# Patient Record
Sex: Male | Born: 1945 | Race: White | Hispanic: No | Marital: Married | Smoking: Former smoker
Health system: Southern US, Community
[De-identification: ages and names within clinical notes are randomized; demographics above are authoritative.]

## PROBLEM LIST (undated history)

## (undated) DIAGNOSIS — E7800 Pure hypercholesterolemia, unspecified: Secondary | ICD-10-CM

## (undated) DIAGNOSIS — I10 Essential (primary) hypertension: Secondary | ICD-10-CM

## (undated) DIAGNOSIS — E079 Disorder of thyroid, unspecified: Secondary | ICD-10-CM

## (undated) DIAGNOSIS — E119 Type 2 diabetes mellitus without complications: Secondary | ICD-10-CM

## (undated) HISTORY — PX: ELBOW SURGERY: SHX618

---

## 2017-01-29 DIAGNOSIS — Z23 Encounter for immunization: Secondary | ICD-10-CM | POA: Diagnosis not present

## 2017-01-29 DIAGNOSIS — E1165 Type 2 diabetes mellitus with hyperglycemia: Secondary | ICD-10-CM | POA: Diagnosis not present

## 2017-01-29 DIAGNOSIS — E039 Hypothyroidism, unspecified: Secondary | ICD-10-CM | POA: Diagnosis not present

## 2017-01-29 DIAGNOSIS — Z6836 Body mass index (BMI) 36.0-36.9, adult: Secondary | ICD-10-CM | POA: Diagnosis not present

## 2017-01-29 DIAGNOSIS — R739 Hyperglycemia, unspecified: Secondary | ICD-10-CM | POA: Diagnosis not present

## 2017-01-29 DIAGNOSIS — R7309 Other abnormal glucose: Secondary | ICD-10-CM | POA: Diagnosis not present

## 2017-02-07 DIAGNOSIS — I10 Essential (primary) hypertension: Secondary | ICD-10-CM | POA: Diagnosis not present

## 2017-02-07 DIAGNOSIS — E785 Hyperlipidemia, unspecified: Secondary | ICD-10-CM | POA: Diagnosis not present

## 2017-02-07 DIAGNOSIS — Z1211 Encounter for screening for malignant neoplasm of colon: Secondary | ICD-10-CM | POA: Diagnosis not present

## 2017-02-07 DIAGNOSIS — E039 Hypothyroidism, unspecified: Secondary | ICD-10-CM | POA: Diagnosis not present

## 2017-02-07 DIAGNOSIS — K5730 Diverticulosis of large intestine without perforation or abscess without bleeding: Secondary | ICD-10-CM | POA: Diagnosis not present

## 2017-02-07 DIAGNOSIS — Z79899 Other long term (current) drug therapy: Secondary | ICD-10-CM | POA: Diagnosis not present

## 2017-02-07 DIAGNOSIS — Z8719 Personal history of other diseases of the digestive system: Secondary | ICD-10-CM | POA: Diagnosis not present

## 2017-02-07 DIAGNOSIS — Z87891 Personal history of nicotine dependence: Secondary | ICD-10-CM | POA: Diagnosis not present

## 2017-05-31 DIAGNOSIS — Z125 Encounter for screening for malignant neoplasm of prostate: Secondary | ICD-10-CM | POA: Diagnosis not present

## 2017-05-31 DIAGNOSIS — R739 Hyperglycemia, unspecified: Secondary | ICD-10-CM | POA: Diagnosis not present

## 2017-05-31 DIAGNOSIS — E1165 Type 2 diabetes mellitus with hyperglycemia: Secondary | ICD-10-CM | POA: Diagnosis not present

## 2017-05-31 DIAGNOSIS — E785 Hyperlipidemia, unspecified: Secondary | ICD-10-CM | POA: Diagnosis not present

## 2017-05-31 DIAGNOSIS — I10 Essential (primary) hypertension: Secondary | ICD-10-CM | POA: Diagnosis not present

## 2017-05-31 DIAGNOSIS — R7309 Other abnormal glucose: Secondary | ICD-10-CM | POA: Diagnosis not present

## 2017-05-31 DIAGNOSIS — Z6828 Body mass index (BMI) 28.0-28.9, adult: Secondary | ICD-10-CM | POA: Diagnosis not present

## 2017-05-31 DIAGNOSIS — E039 Hypothyroidism, unspecified: Secondary | ICD-10-CM | POA: Diagnosis not present

## 2017-05-31 DIAGNOSIS — E559 Vitamin D deficiency, unspecified: Secondary | ICD-10-CM | POA: Diagnosis not present

## 2017-06-24 DIAGNOSIS — K623 Rectal prolapse: Secondary | ICD-10-CM | POA: Diagnosis not present

## 2017-06-24 DIAGNOSIS — R198 Other specified symptoms and signs involving the digestive system and abdomen: Secondary | ICD-10-CM | POA: Diagnosis not present

## 2017-06-24 DIAGNOSIS — Z6828 Body mass index (BMI) 28.0-28.9, adult: Secondary | ICD-10-CM | POA: Diagnosis not present

## 2017-06-24 DIAGNOSIS — K625 Hemorrhage of anus and rectum: Secondary | ICD-10-CM | POA: Diagnosis not present

## 2017-07-05 DIAGNOSIS — K600 Acute anal fissure: Secondary | ICD-10-CM | POA: Diagnosis not present

## 2017-08-30 DIAGNOSIS — Z6828 Body mass index (BMI) 28.0-28.9, adult: Secondary | ICD-10-CM | POA: Diagnosis not present

## 2017-08-30 DIAGNOSIS — R7309 Other abnormal glucose: Secondary | ICD-10-CM | POA: Diagnosis not present

## 2017-08-30 DIAGNOSIS — R739 Hyperglycemia, unspecified: Secondary | ICD-10-CM | POA: Diagnosis not present

## 2017-08-30 DIAGNOSIS — Z0189 Encounter for other specified special examinations: Secondary | ICD-10-CM | POA: Diagnosis not present

## 2017-08-30 DIAGNOSIS — Z23 Encounter for immunization: Secondary | ICD-10-CM | POA: Diagnosis not present

## 2017-08-30 DIAGNOSIS — E785 Hyperlipidemia, unspecified: Secondary | ICD-10-CM | POA: Diagnosis not present

## 2017-08-30 DIAGNOSIS — I10 Essential (primary) hypertension: Secondary | ICD-10-CM | POA: Diagnosis not present

## 2017-08-30 DIAGNOSIS — E1165 Type 2 diabetes mellitus with hyperglycemia: Secondary | ICD-10-CM | POA: Diagnosis not present

## 2017-12-03 DIAGNOSIS — I10 Essential (primary) hypertension: Secondary | ICD-10-CM | POA: Diagnosis not present

## 2017-12-03 DIAGNOSIS — E785 Hyperlipidemia, unspecified: Secondary | ICD-10-CM | POA: Diagnosis not present

## 2017-12-03 DIAGNOSIS — Z6828 Body mass index (BMI) 28.0-28.9, adult: Secondary | ICD-10-CM | POA: Diagnosis not present

## 2017-12-03 DIAGNOSIS — R739 Hyperglycemia, unspecified: Secondary | ICD-10-CM | POA: Diagnosis not present

## 2017-12-03 DIAGNOSIS — Z0189 Encounter for other specified special examinations: Secondary | ICD-10-CM | POA: Diagnosis not present

## 2017-12-03 DIAGNOSIS — E039 Hypothyroidism, unspecified: Secondary | ICD-10-CM | POA: Diagnosis not present

## 2017-12-03 DIAGNOSIS — R7309 Other abnormal glucose: Secondary | ICD-10-CM | POA: Diagnosis not present

## 2018-01-07 DIAGNOSIS — L255 Unspecified contact dermatitis due to plants, except food: Secondary | ICD-10-CM | POA: Diagnosis not present

## 2018-06-25 DIAGNOSIS — S46301A Unspecified injury of muscle, fascia and tendon of triceps, right arm, initial encounter: Secondary | ICD-10-CM | POA: Diagnosis not present

## 2018-06-30 DIAGNOSIS — S46211A Strain of muscle, fascia and tendon of other parts of biceps, right arm, initial encounter: Secondary | ICD-10-CM | POA: Diagnosis not present

## 2018-06-30 DIAGNOSIS — M25521 Pain in right elbow: Secondary | ICD-10-CM | POA: Diagnosis not present

## 2018-07-01 DIAGNOSIS — R600 Localized edema: Secondary | ICD-10-CM | POA: Diagnosis not present

## 2018-07-01 DIAGNOSIS — S56511A Strain of other extensor muscle, fascia and tendon at forearm level, right arm, initial encounter: Secondary | ICD-10-CM | POA: Diagnosis not present

## 2018-07-01 DIAGNOSIS — S46211A Strain of muscle, fascia and tendon of other parts of biceps, right arm, initial encounter: Secondary | ICD-10-CM | POA: Diagnosis not present

## 2018-07-03 DIAGNOSIS — I10 Essential (primary) hypertension: Secondary | ICD-10-CM | POA: Diagnosis not present

## 2018-07-03 DIAGNOSIS — S46211A Strain of muscle, fascia and tendon of other parts of biceps, right arm, initial encounter: Secondary | ICD-10-CM | POA: Diagnosis not present

## 2018-07-21 DIAGNOSIS — S46211A Strain of muscle, fascia and tendon of other parts of biceps, right arm, initial encounter: Secondary | ICD-10-CM | POA: Diagnosis not present

## 2018-07-30 DIAGNOSIS — M6281 Muscle weakness (generalized): Secondary | ICD-10-CM | POA: Diagnosis not present

## 2018-07-30 DIAGNOSIS — M25421 Effusion, right elbow: Secondary | ICD-10-CM | POA: Diagnosis not present

## 2018-07-30 DIAGNOSIS — M25621 Stiffness of right elbow, not elsewhere classified: Secondary | ICD-10-CM | POA: Diagnosis not present

## 2018-07-30 DIAGNOSIS — M25521 Pain in right elbow: Secondary | ICD-10-CM | POA: Diagnosis not present

## 2018-08-04 DIAGNOSIS — M25421 Effusion, right elbow: Secondary | ICD-10-CM | POA: Diagnosis not present

## 2018-08-04 DIAGNOSIS — M6281 Muscle weakness (generalized): Secondary | ICD-10-CM | POA: Diagnosis not present

## 2018-08-04 DIAGNOSIS — M25521 Pain in right elbow: Secondary | ICD-10-CM | POA: Diagnosis not present

## 2018-08-04 DIAGNOSIS — M25621 Stiffness of right elbow, not elsewhere classified: Secondary | ICD-10-CM | POA: Diagnosis not present

## 2018-08-07 DIAGNOSIS — M6281 Muscle weakness (generalized): Secondary | ICD-10-CM | POA: Diagnosis not present

## 2018-08-07 DIAGNOSIS — M25621 Stiffness of right elbow, not elsewhere classified: Secondary | ICD-10-CM | POA: Diagnosis not present

## 2018-08-07 DIAGNOSIS — M25421 Effusion, right elbow: Secondary | ICD-10-CM | POA: Diagnosis not present

## 2018-08-07 DIAGNOSIS — M25521 Pain in right elbow: Secondary | ICD-10-CM | POA: Diagnosis not present

## 2018-08-11 DIAGNOSIS — Z9889 Other specified postprocedural states: Secondary | ICD-10-CM | POA: Diagnosis not present

## 2018-08-12 DIAGNOSIS — M25421 Effusion, right elbow: Secondary | ICD-10-CM | POA: Diagnosis not present

## 2018-08-12 DIAGNOSIS — M25621 Stiffness of right elbow, not elsewhere classified: Secondary | ICD-10-CM | POA: Diagnosis not present

## 2018-08-12 DIAGNOSIS — M25521 Pain in right elbow: Secondary | ICD-10-CM | POA: Diagnosis not present

## 2018-08-12 DIAGNOSIS — M6281 Muscle weakness (generalized): Secondary | ICD-10-CM | POA: Diagnosis not present

## 2018-08-14 DIAGNOSIS — M25421 Effusion, right elbow: Secondary | ICD-10-CM | POA: Diagnosis not present

## 2018-08-14 DIAGNOSIS — M25621 Stiffness of right elbow, not elsewhere classified: Secondary | ICD-10-CM | POA: Diagnosis not present

## 2018-08-14 DIAGNOSIS — M25521 Pain in right elbow: Secondary | ICD-10-CM | POA: Diagnosis not present

## 2018-08-14 DIAGNOSIS — M6281 Muscle weakness (generalized): Secondary | ICD-10-CM | POA: Diagnosis not present

## 2018-08-18 DIAGNOSIS — M25521 Pain in right elbow: Secondary | ICD-10-CM | POA: Diagnosis not present

## 2018-08-18 DIAGNOSIS — M25421 Effusion, right elbow: Secondary | ICD-10-CM | POA: Diagnosis not present

## 2018-08-18 DIAGNOSIS — M25621 Stiffness of right elbow, not elsewhere classified: Secondary | ICD-10-CM | POA: Diagnosis not present

## 2018-08-18 DIAGNOSIS — M6281 Muscle weakness (generalized): Secondary | ICD-10-CM | POA: Diagnosis not present

## 2018-09-22 DIAGNOSIS — Z9889 Other specified postprocedural states: Secondary | ICD-10-CM | POA: Diagnosis not present

## 2018-10-13 DIAGNOSIS — Z6828 Body mass index (BMI) 28.0-28.9, adult: Secondary | ICD-10-CM | POA: Diagnosis not present

## 2018-10-13 DIAGNOSIS — I10 Essential (primary) hypertension: Secondary | ICD-10-CM | POA: Diagnosis not present

## 2018-10-13 DIAGNOSIS — E038 Other specified hypothyroidism: Secondary | ICD-10-CM | POA: Diagnosis not present

## 2018-10-13 DIAGNOSIS — E7849 Other hyperlipidemia: Secondary | ICD-10-CM | POA: Diagnosis not present

## 2018-10-13 DIAGNOSIS — E119 Type 2 diabetes mellitus without complications: Secondary | ICD-10-CM | POA: Diagnosis not present

## 2018-10-13 DIAGNOSIS — Z1389 Encounter for screening for other disorder: Secondary | ICD-10-CM | POA: Diagnosis not present

## 2018-10-13 DIAGNOSIS — Z23 Encounter for immunization: Secondary | ICD-10-CM | POA: Diagnosis not present

## 2018-10-13 DIAGNOSIS — Z125 Encounter for screening for malignant neoplasm of prostate: Secondary | ICD-10-CM | POA: Diagnosis not present

## 2019-01-13 DIAGNOSIS — Z6828 Body mass index (BMI) 28.0-28.9, adult: Secondary | ICD-10-CM | POA: Diagnosis not present

## 2019-01-13 DIAGNOSIS — I10 Essential (primary) hypertension: Secondary | ICD-10-CM | POA: Diagnosis not present

## 2019-01-13 DIAGNOSIS — E7849 Other hyperlipidemia: Secondary | ICD-10-CM | POA: Diagnosis not present

## 2019-01-13 DIAGNOSIS — E038 Other specified hypothyroidism: Secondary | ICD-10-CM | POA: Diagnosis not present

## 2019-01-13 DIAGNOSIS — E119 Type 2 diabetes mellitus without complications: Secondary | ICD-10-CM | POA: Diagnosis not present

## 2019-05-08 DIAGNOSIS — E119 Type 2 diabetes mellitus without complications: Secondary | ICD-10-CM | POA: Diagnosis not present

## 2019-05-08 DIAGNOSIS — E039 Hypothyroidism, unspecified: Secondary | ICD-10-CM | POA: Diagnosis not present

## 2019-05-08 DIAGNOSIS — E7870 Disorder of bile acid and cholesterol metabolism, unspecified: Secondary | ICD-10-CM | POA: Diagnosis not present

## 2019-05-08 DIAGNOSIS — I10 Essential (primary) hypertension: Secondary | ICD-10-CM | POA: Diagnosis not present

## 2019-09-10 DIAGNOSIS — Z23 Encounter for immunization: Secondary | ICD-10-CM | POA: Diagnosis not present

## 2019-11-15 DIAGNOSIS — Z20828 Contact with and (suspected) exposure to other viral communicable diseases: Secondary | ICD-10-CM | POA: Diagnosis not present

## 2019-12-01 DIAGNOSIS — E039 Hypothyroidism, unspecified: Secondary | ICD-10-CM | POA: Diagnosis not present

## 2019-12-01 DIAGNOSIS — Z1331 Encounter for screening for depression: Secondary | ICD-10-CM | POA: Diagnosis not present

## 2019-12-01 DIAGNOSIS — Z1339 Encounter for screening examination for other mental health and behavioral disorders: Secondary | ICD-10-CM | POA: Diagnosis not present

## 2019-12-01 DIAGNOSIS — Z0000 Encounter for general adult medical examination without abnormal findings: Secondary | ICD-10-CM | POA: Diagnosis not present

## 2019-12-01 DIAGNOSIS — E7870 Disorder of bile acid and cholesterol metabolism, unspecified: Secondary | ICD-10-CM | POA: Diagnosis not present

## 2019-12-01 DIAGNOSIS — E119 Type 2 diabetes mellitus without complications: Secondary | ICD-10-CM | POA: Diagnosis not present

## 2019-12-01 DIAGNOSIS — I10 Essential (primary) hypertension: Secondary | ICD-10-CM | POA: Diagnosis not present

## 2019-12-08 DIAGNOSIS — Z1212 Encounter for screening for malignant neoplasm of rectum: Secondary | ICD-10-CM | POA: Diagnosis not present

## 2019-12-09 DIAGNOSIS — Z23 Encounter for immunization: Secondary | ICD-10-CM | POA: Diagnosis not present

## 2019-12-30 DIAGNOSIS — Z23 Encounter for immunization: Secondary | ICD-10-CM | POA: Diagnosis not present

## 2019-12-30 DIAGNOSIS — I10 Essential (primary) hypertension: Secondary | ICD-10-CM | POA: Diagnosis not present

## 2020-01-13 DIAGNOSIS — E119 Type 2 diabetes mellitus without complications: Secondary | ICD-10-CM | POA: Diagnosis not present

## 2020-06-22 DIAGNOSIS — I10 Essential (primary) hypertension: Secondary | ICD-10-CM | POA: Diagnosis not present

## 2020-06-22 DIAGNOSIS — E119 Type 2 diabetes mellitus without complications: Secondary | ICD-10-CM | POA: Diagnosis not present

## 2020-06-22 DIAGNOSIS — E039 Hypothyroidism, unspecified: Secondary | ICD-10-CM | POA: Diagnosis not present

## 2020-06-22 DIAGNOSIS — E7800 Pure hypercholesterolemia, unspecified: Secondary | ICD-10-CM | POA: Diagnosis not present

## 2020-08-06 DIAGNOSIS — Z23 Encounter for immunization: Secondary | ICD-10-CM | POA: Diagnosis not present

## 2020-09-26 DIAGNOSIS — E119 Type 2 diabetes mellitus without complications: Secondary | ICD-10-CM | POA: Diagnosis not present

## 2020-09-26 DIAGNOSIS — I10 Essential (primary) hypertension: Secondary | ICD-10-CM | POA: Diagnosis not present

## 2020-09-26 DIAGNOSIS — Z23 Encounter for immunization: Secondary | ICD-10-CM | POA: Diagnosis not present

## 2020-09-26 DIAGNOSIS — E039 Hypothyroidism, unspecified: Secondary | ICD-10-CM | POA: Diagnosis not present

## 2020-09-26 DIAGNOSIS — E7800 Pure hypercholesterolemia, unspecified: Secondary | ICD-10-CM | POA: Diagnosis not present

## 2020-11-24 DIAGNOSIS — D485 Neoplasm of uncertain behavior of skin: Secondary | ICD-10-CM | POA: Diagnosis not present

## 2020-11-24 DIAGNOSIS — I788 Other diseases of capillaries: Secondary | ICD-10-CM | POA: Diagnosis not present

## 2020-11-24 DIAGNOSIS — D2261 Melanocytic nevi of right upper limb, including shoulder: Secondary | ICD-10-CM | POA: Diagnosis not present

## 2020-11-24 DIAGNOSIS — L814 Other melanin hyperpigmentation: Secondary | ICD-10-CM | POA: Diagnosis not present

## 2020-11-24 DIAGNOSIS — D2262 Melanocytic nevi of left upper limb, including shoulder: Secondary | ICD-10-CM | POA: Diagnosis not present

## 2020-11-24 DIAGNOSIS — D1801 Hemangioma of skin and subcutaneous tissue: Secondary | ICD-10-CM | POA: Diagnosis not present

## 2020-11-24 DIAGNOSIS — D225 Melanocytic nevi of trunk: Secondary | ICD-10-CM | POA: Diagnosis not present

## 2020-11-25 DIAGNOSIS — E039 Hypothyroidism, unspecified: Secondary | ICD-10-CM | POA: Diagnosis not present

## 2020-11-25 DIAGNOSIS — E119 Type 2 diabetes mellitus without complications: Secondary | ICD-10-CM | POA: Diagnosis not present

## 2020-11-25 DIAGNOSIS — E7800 Pure hypercholesterolemia, unspecified: Secondary | ICD-10-CM | POA: Diagnosis not present

## 2020-11-25 DIAGNOSIS — Z125 Encounter for screening for malignant neoplasm of prostate: Secondary | ICD-10-CM | POA: Diagnosis not present

## 2020-12-02 DIAGNOSIS — E7800 Pure hypercholesterolemia, unspecified: Secondary | ICD-10-CM | POA: Diagnosis not present

## 2020-12-02 DIAGNOSIS — Z0000 Encounter for general adult medical examination without abnormal findings: Secondary | ICD-10-CM | POA: Diagnosis not present

## 2020-12-02 DIAGNOSIS — E039 Hypothyroidism, unspecified: Secondary | ICD-10-CM | POA: Diagnosis not present

## 2020-12-02 DIAGNOSIS — Z1212 Encounter for screening for malignant neoplasm of rectum: Secondary | ICD-10-CM | POA: Diagnosis not present

## 2020-12-02 DIAGNOSIS — E119 Type 2 diabetes mellitus without complications: Secondary | ICD-10-CM | POA: Diagnosis not present

## 2020-12-02 DIAGNOSIS — I10 Essential (primary) hypertension: Secondary | ICD-10-CM | POA: Diagnosis not present

## 2020-12-02 DIAGNOSIS — R82998 Other abnormal findings in urine: Secondary | ICD-10-CM | POA: Diagnosis not present

## 2021-02-27 DIAGNOSIS — Z23 Encounter for immunization: Secondary | ICD-10-CM | POA: Diagnosis not present

## 2021-06-29 DIAGNOSIS — I10 Essential (primary) hypertension: Secondary | ICD-10-CM | POA: Diagnosis not present

## 2021-06-29 DIAGNOSIS — E119 Type 2 diabetes mellitus without complications: Secondary | ICD-10-CM | POA: Diagnosis not present

## 2021-06-29 DIAGNOSIS — E039 Hypothyroidism, unspecified: Secondary | ICD-10-CM | POA: Diagnosis not present

## 2021-06-29 DIAGNOSIS — E7800 Pure hypercholesterolemia, unspecified: Secondary | ICD-10-CM | POA: Diagnosis not present

## 2021-07-03 DIAGNOSIS — Z20822 Contact with and (suspected) exposure to covid-19: Secondary | ICD-10-CM | POA: Diagnosis not present

## 2021-08-03 DIAGNOSIS — Z23 Encounter for immunization: Secondary | ICD-10-CM | POA: Diagnosis not present

## 2021-08-15 DIAGNOSIS — I10 Essential (primary) hypertension: Secondary | ICD-10-CM | POA: Diagnosis not present

## 2021-08-15 DIAGNOSIS — L729 Follicular cyst of the skin and subcutaneous tissue, unspecified: Secondary | ICD-10-CM | POA: Diagnosis not present

## 2021-08-15 DIAGNOSIS — L0889 Other specified local infections of the skin and subcutaneous tissue: Secondary | ICD-10-CM | POA: Diagnosis not present

## 2021-11-27 DIAGNOSIS — D225 Melanocytic nevi of trunk: Secondary | ICD-10-CM | POA: Diagnosis not present

## 2021-11-27 DIAGNOSIS — D1801 Hemangioma of skin and subcutaneous tissue: Secondary | ICD-10-CM | POA: Diagnosis not present

## 2021-11-27 DIAGNOSIS — D485 Neoplasm of uncertain behavior of skin: Secondary | ICD-10-CM | POA: Diagnosis not present

## 2021-11-27 DIAGNOSIS — L821 Other seborrheic keratosis: Secondary | ICD-10-CM | POA: Diagnosis not present

## 2021-11-27 DIAGNOSIS — L570 Actinic keratosis: Secondary | ICD-10-CM | POA: Diagnosis not present

## 2021-11-27 DIAGNOSIS — L814 Other melanin hyperpigmentation: Secondary | ICD-10-CM | POA: Diagnosis not present

## 2021-12-07 DIAGNOSIS — E039 Hypothyroidism, unspecified: Secondary | ICD-10-CM | POA: Diagnosis not present

## 2021-12-07 DIAGNOSIS — Z125 Encounter for screening for malignant neoplasm of prostate: Secondary | ICD-10-CM | POA: Diagnosis not present

## 2021-12-07 DIAGNOSIS — E119 Type 2 diabetes mellitus without complications: Secondary | ICD-10-CM | POA: Diagnosis not present

## 2021-12-07 DIAGNOSIS — I10 Essential (primary) hypertension: Secondary | ICD-10-CM | POA: Diagnosis not present

## 2021-12-07 DIAGNOSIS — E7800 Pure hypercholesterolemia, unspecified: Secondary | ICD-10-CM | POA: Diagnosis not present

## 2021-12-14 DIAGNOSIS — Z1212 Encounter for screening for malignant neoplasm of rectum: Secondary | ICD-10-CM | POA: Diagnosis not present

## 2021-12-14 DIAGNOSIS — E7800 Pure hypercholesterolemia, unspecified: Secondary | ICD-10-CM | POA: Diagnosis not present

## 2021-12-14 DIAGNOSIS — Z0000 Encounter for general adult medical examination without abnormal findings: Secondary | ICD-10-CM | POA: Diagnosis not present

## 2021-12-14 DIAGNOSIS — Z1331 Encounter for screening for depression: Secondary | ICD-10-CM | POA: Diagnosis not present

## 2021-12-14 DIAGNOSIS — Z1339 Encounter for screening examination for other mental health and behavioral disorders: Secondary | ICD-10-CM | POA: Diagnosis not present

## 2021-12-14 DIAGNOSIS — E119 Type 2 diabetes mellitus without complications: Secondary | ICD-10-CM | POA: Diagnosis not present

## 2021-12-14 DIAGNOSIS — I10 Essential (primary) hypertension: Secondary | ICD-10-CM | POA: Diagnosis not present

## 2021-12-14 DIAGNOSIS — E039 Hypothyroidism, unspecified: Secondary | ICD-10-CM | POA: Diagnosis not present

## 2021-12-14 DIAGNOSIS — R82998 Other abnormal findings in urine: Secondary | ICD-10-CM | POA: Diagnosis not present

## 2022-01-27 ENCOUNTER — Encounter (HOSPITAL_BASED_OUTPATIENT_CLINIC_OR_DEPARTMENT_OTHER): Payer: Self-pay | Admitting: *Deleted

## 2022-01-27 ENCOUNTER — Inpatient Hospital Stay (HOSPITAL_BASED_OUTPATIENT_CLINIC_OR_DEPARTMENT_OTHER)
Admission: EM | Admit: 2022-01-27 | Discharge: 2022-01-30 | DRG: 310 | Disposition: A | Discharge status: Home / Self Care | Payer: Medicare Other | Attending: Internal Medicine | Admitting: Internal Medicine

## 2022-01-27 ENCOUNTER — Emergency Department (HOSPITAL_BASED_OUTPATIENT_CLINIC_OR_DEPARTMENT_OTHER): Payer: Medicare Other

## 2022-01-27 ENCOUNTER — Other Ambulatory Visit: Payer: Self-pay

## 2022-01-27 DIAGNOSIS — E039 Hypothyroidism, unspecified: Secondary | ICD-10-CM | POA: Diagnosis not present

## 2022-01-27 DIAGNOSIS — E785 Hyperlipidemia, unspecified: Secondary | ICD-10-CM | POA: Diagnosis present

## 2022-01-27 DIAGNOSIS — E1165 Type 2 diabetes mellitus with hyperglycemia: Secondary | ICD-10-CM | POA: Diagnosis present

## 2022-01-27 DIAGNOSIS — E7800 Pure hypercholesterolemia, unspecified: Secondary | ICD-10-CM | POA: Diagnosis present

## 2022-01-27 DIAGNOSIS — I4892 Unspecified atrial flutter: Secondary | ICD-10-CM | POA: Diagnosis present

## 2022-01-27 DIAGNOSIS — I119 Hypertensive heart disease without heart failure: Secondary | ICD-10-CM | POA: Diagnosis present

## 2022-01-27 DIAGNOSIS — Z20822 Contact with and (suspected) exposure to covid-19: Secondary | ICD-10-CM | POA: Diagnosis not present

## 2022-01-27 DIAGNOSIS — R000 Tachycardia, unspecified: Secondary | ICD-10-CM | POA: Diagnosis not present

## 2022-01-27 DIAGNOSIS — E119 Type 2 diabetes mellitus without complications: Secondary | ICD-10-CM

## 2022-01-27 DIAGNOSIS — I351 Nonrheumatic aortic (valve) insufficiency: Secondary | ICD-10-CM | POA: Diagnosis present

## 2022-01-27 DIAGNOSIS — I517 Cardiomegaly: Secondary | ICD-10-CM | POA: Diagnosis not present

## 2022-01-27 DIAGNOSIS — I483 Typical atrial flutter: Secondary | ICD-10-CM | POA: Diagnosis not present

## 2022-01-27 DIAGNOSIS — I088 Other rheumatic multiple valve diseases: Secondary | ICD-10-CM | POA: Diagnosis not present

## 2022-01-27 DIAGNOSIS — I441 Atrioventricular block, second degree: Secondary | ICD-10-CM | POA: Diagnosis not present

## 2022-01-27 DIAGNOSIS — Z87891 Personal history of nicotine dependence: Secondary | ICD-10-CM | POA: Diagnosis not present

## 2022-01-27 DIAGNOSIS — Z79890 Hormone replacement therapy (postmenopausal): Secondary | ICD-10-CM | POA: Diagnosis not present

## 2022-01-27 DIAGNOSIS — Z79899 Other long term (current) drug therapy: Secondary | ICD-10-CM | POA: Diagnosis not present

## 2022-01-27 DIAGNOSIS — I4891 Unspecified atrial fibrillation: Secondary | ICD-10-CM | POA: Diagnosis not present

## 2022-01-27 DIAGNOSIS — I10 Essential (primary) hypertension: Secondary | ICD-10-CM | POA: Diagnosis present

## 2022-01-27 HISTORY — DX: Type 2 diabetes mellitus without complications: E11.9

## 2022-01-27 HISTORY — DX: Disorder of thyroid, unspecified: E07.9

## 2022-01-27 HISTORY — DX: Pure hypercholesterolemia, unspecified: E78.00

## 2022-01-27 HISTORY — DX: Essential (primary) hypertension: I10

## 2022-01-27 LAB — CBC
HCT: 52.2 % — ABNORMAL HIGH (ref 39.0–52.0)
HCT: 49.3 % (ref 39.0–52.0)
Hemoglobin: 17.3 g/dL — ABNORMAL HIGH (ref 13.0–17.0)
Hemoglobin: 16.3 g/dL (ref 13.0–17.0)
MCH: 29.5 pg (ref 26.0–34.0)
MCH: 29.1 pg (ref 26.0–34.0)
MCHC: 33.1 g/dL (ref 30.0–36.0)
MCHC: 33.1 g/dL (ref 30.0–36.0)
MCV: 89.1 fL (ref 80.0–100.0)
MCV: 87.9 fL (ref 80.0–100.0)
Platelets: 258 10*3/uL (ref 150–400)
Platelets: 234 10*3/uL (ref 150–400)
RBC: 5.86 MIL/uL — ABNORMAL HIGH (ref 4.22–5.81)
RBC: 5.61 MIL/uL (ref 4.22–5.81)
RDW: 13.8 % (ref 11.5–15.5)
RDW: 13.8 % (ref 11.5–15.5)
WBC: 9 10*3/uL (ref 4.0–10.5)
WBC: 8 10*3/uL (ref 4.0–10.5)
nRBC: 0 % (ref 0.0–0.2)
nRBC: 0 % (ref 0.0–0.2)

## 2022-01-27 LAB — HEPATIC FUNCTION PANEL
ALT: 30 U/L (ref 0–44)
AST: 23 U/L (ref 15–41)
Albumin: 4 g/dL (ref 3.5–5.0)
Alkaline Phosphatase: 36 U/L — ABNORMAL LOW (ref 38–126)
Bilirubin, Direct: 0.2 mg/dL (ref 0.0–0.2)
Indirect Bilirubin: 0.3 mg/dL (ref 0.3–0.9)
Total Bilirubin: 0.5 mg/dL (ref 0.3–1.2)
Total Protein: 7 g/dL (ref 6.5–8.1)

## 2022-01-27 LAB — DIFFERENTIAL
Abs Immature Granulocytes: 0.03 10*3/uL (ref 0.00–0.07)
Basophils Absolute: 0.1 10*3/uL (ref 0.0–0.1)
Basophils Relative: 1 %
Eosinophils Absolute: 0.1 10*3/uL (ref 0.0–0.5)
Eosinophils Relative: 1 %
Immature Granulocytes: 0 %
Lymphocytes Relative: 23 %
Lymphs Abs: 1.9 10*3/uL (ref 0.7–4.0)
Monocytes Absolute: 0.7 10*3/uL (ref 0.1–1.0)
Monocytes Relative: 9 %
Neutro Abs: 5.3 10*3/uL (ref 1.7–7.7)
Neutrophils Relative %: 66 %

## 2022-01-27 LAB — BASIC METABOLIC PANEL
Anion gap: 11 (ref 5–15)
BUN: 29 mg/dL — ABNORMAL HIGH (ref 8–23)
CO2: 25 mmol/L (ref 22–32)
Calcium: 10.5 mg/dL — ABNORMAL HIGH (ref 8.9–10.3)
Chloride: 100 mmol/L (ref 98–111)
Creatinine, Ser: 1.29 mg/dL — ABNORMAL HIGH (ref 0.61–1.24)
GFR, Estimated: 58 mL/min — ABNORMAL LOW (ref >60)
Glucose, Bld: 169 mg/dL — ABNORMAL HIGH (ref 70–99)
Potassium: 4 mmol/L (ref 3.5–5.1)
Sodium: 136 mmol/L (ref 135–145)

## 2022-01-27 LAB — RESP PANEL BY RT-PCR (FLU A&B, COVID) ARPGX2
Influenza A by PCR: NEGATIVE
Influenza B by PCR: NEGATIVE
SARS Coronavirus 2 by RT PCR: NEGATIVE

## 2022-01-27 MED ORDER — DILTIAZEM HCL 25 MG/5ML IV SOLN
5.0000 mg | Freq: Once | INTRAVENOUS | Status: DC
  Filled 2022-01-27: qty 5

## 2022-01-27 MED ORDER — SODIUM CHLORIDE 0.9 % IV SOLN: INTRAVENOUS | Status: DC

## 2022-01-27 MED ORDER — SODIUM CHLORIDE 0.9 % IV BOLUS
1000.0000 mL | Freq: Once | INTRAVENOUS | Status: AC
  Administered 2022-01-27: 1000 mL via INTRAVENOUS

## 2022-01-27 MED ORDER — DILTIAZEM HCL-DEXTROSE 125-5 MG/125ML-% IV SOLN (PREMIX)
5.0000 mg/h | INTRAVENOUS | Status: DC
  Administered 2022-01-27: 5 mg/h via INTRAVENOUS
  Administered 2022-01-27: 10 mg/h via INTRAVENOUS
  Administered 2022-01-27: 15 mg/h via INTRAVENOUS
  Administered 2022-01-28: 5 mg/h via INTRAVENOUS
  Filled 2022-01-27 (×2): qty 125

## 2022-01-27 NOTE — ED Provider Notes (Signed)
?[facility] EMER[facility] DEPT ?Provider Note ? ? ?CSN: [id number] ?Arrival date & time: 01/27/22  1944 ? ?  ? ?History ? ?Chief Complaint  ?Patient presents with  ? Tachycardia  ? ? ?Frederick Michael is a 76 y.o. male. ? ?Patient yesterday afternoon with a strange feeling in his chest really no pain no pressure could not tell if heart was going fast but it felt off.  I continued through today.  When he stood up quickly did they get little lightheaded did not pass out.  Again no real chest pain.  He checked his pulse ox that he had at home and saw that his heart rate was in the 130s and this reason why he came in.  Normally his heart rates in the 50s.  No history of atrial fib or flutter in the past.  EKG here consistent with atrial flutter.  Patient has not had COVID recently.  Has not had any of the COVID vaccines recently.  The last 1 was in September.  ? ? Past medical history significant for high cholesterol hypertension thyroid disease.  Patient is on Synthroid.  And patient is on lisinopril for the hypertension.  Patient is not on any blood thinners. ? ? ?  ? ?Home Medications ?Prior to Admission medications   ?Not on File  ?   ? ?Allergies    ?Patient has no known allergies.   ? ?Review of Systems   ?Review of Systems  ?Constitutional:  Negative for chills and fever.  ?HENT:  Negative for rhinorrhea and sore throat.   ?Eyes:  Negative for visual disturbance.  ?Respiratory:  Negative for cough and shortness of breath.   ?Cardiovascular:  Positive for palpitations. Negative for chest pain and leg swelling.  ?Gastrointestinal:  Negative for abdominal pain, diarrhea, nausea and vomiting.  ?Genitourinary:  Negative for dysuria.  ?Musculoskeletal:  Negative for back pain and neck pain.  ?Skin:  Negative for rash.  ?Neurological:  Positive for light-headedness. Negative for dizziness and headaches.  ?Hematological:  Does not bruise/bleed easily.  ?Psychiatric/Behavioral:  Negative for confusion.    ? ?Physical Exam ?Updated Vital Signs ?BP 116/75   Pulse (!) 135   Temp 97.8 ?F (36.6 ?C)   Resp 19   Ht 1.905 m ('6\' 3"'$ )   Wt 93.9 kg   SpO2 99%   BMI 25.87 kg/m?  ?Physical Exam ?Vitals and nursing note reviewed.  ?Constitutional:   ?   General: He is not in acute distress. ?   Appearance: Normal appearance. He is well-developed.  ?HENT:  ?   Head: Normocephalic and atraumatic.  ?Eyes:  ?   Extraocular Movements: Extraocular movements intact.  ?   Conjunctiva/sclera: Conjunctivae normal.  ?   Pupils: Pupils are equal, round, and reactive to light.  ?Cardiovascular:  ?   Rate and Rhythm: Regular rhythm. Tachycardia present.  ?   Heart sounds: No murmur heard. ?Pulmonary:  ?   Effort: Pulmonary effort is normal. No respiratory distress.  ?   Breath sounds: Normal breath sounds. No wheezing, rhonchi or rales.  ?Abdominal:  ?   Palpations: Abdomen is soft.  ?   Tenderness: There is no abdominal tenderness.  ?Musculoskeletal:     ?   General: No swelling.  ?   Cervical back: Neck supple.  ?   Right lower leg: No edema.  ?   Left lower leg: No edema.  ?Skin: ?   General: Skin is warm and dry.  ?   Capillary  Refill: Capillary refill takes less than 2 seconds.  ?Neurological:  ?   General: No focal deficit present.  ?   Mental Status: He is alert and oriented to person, place, and time.  ?Psychiatric:     ?   Mood and Affect: Mood normal.  ? ? ?ED Results / Procedures / Treatments   ?Labs ?(all labs ordered are listed, but only abnormal results are displayed) ?Labs Reviewed  ?CBC - Abnormal; Notable for the following components:  ?    Result Value  ? RBC 5.86 (*)   ? Hemoglobin 17.3 (*)   ? HCT 52.2 (*)   ? All other components within normal limits  ?RESP PANEL BY RT-PCR (FLU A&B, COVID) [id number]  ?BASIC METABOLIC PANEL  ?DIFFERENTIAL  ?HEPATIC FUNCTION PANEL  ?TSH  ? ? ?EKG ?EKG Interpretation ? ?Date/Time:  Saturday January 27 2022 19:55:52 EDT ?Ventricular Rate:  136 ?PR Interval:    ?QRS Duration: 90 ?QT  Interval:  310 ?QTC Calculation: 466 ?R Axis:   32 ?Text Interpretation: Atrial flutter with 2:1 A-V conduction ST & T wave abnormality, consider anterolateral ischemia Abnormal ECG No previous ECGs available Confirmed by Fredia Sorrow [phone]) on 01/27/2022 8:17:01 PM ? ?Radiology ?No results found. ? ?Procedures ?Procedures  ? ? ?Medications Ordered in ED ?Medications  ?0.9 %  sodium chloride infusion (has no administration in time range)  ?diltiazem (CARDIZEM) 125 mg in dextrose 5% 125 mL (1 mg/mL) infusion (has no administration in time range)  ?diltiazem (CARDIZEM) injection 5 mg (has no administration in time range)  ? ? ?ED Course/ Medical Decision Making/ A&P ?  ?                        ?Medical Decision Making ?Amount and/or Complexity of Data Reviewed ?Labs: ordered. ?Radiology: ordered. ? ?Risk ?Prescription drug management. ?Decision regarding hospitalization. ? ? ?CRITICAL CARE ?Performed by: Fredia Sorrow ?Total critical care time: 60 minutes ?Critical care time was exclusive of separately billable procedures and treating other patients. ?Critical care was necessary to treat or prevent imminent or life-threatening deterioration. ?Critical care was time spent personally by me on the following activities: development of treatment plan with patient and/or surrogate as well as nursing, discussions with consultants, evaluation of patient's response to treatment, examination of patient, obtaining history from patient or surrogate, ordering and performing treatments and interventions, ordering and review of laboratory studies, ordering and review of radiographic studies, pulse oximetry and re-evaluation of patient's condition. ? ?Cardiac monitor and EKG consistent with atrial flutter.  We will start patient on diltiazem drip.  We will get chest x-ray.  Labs.  Would not get troponin patient without any significant chest pressure pain or discomfort.  Patient most likely will require admission unless he  converts.  Have ordered a 5 mg adult as an IV push and then ordered the titrated drip. ? ?CBC is already back white count 9 hemoglobin 17.3. ? ?Rest of patient's labs without significant abnormality.  Could not had a function normal basic metabolic panel significant for glucose of 169 creatinine up at 1.29.  Calcium Of 10.5.  GFR 59.  Chest X-Ray Shows Cardiomegaly without Any Abnormalities. ? ?Patient Started on a Dill Drip.  Has A Lot Of Difficulty with Blood Pressure.  Will Give Some IV Fluids.  Laying Him down Getting Systolic of 604 While on the Dill Drip.  Nurse Will Be Titrating. ? ?Discussed with Hospitalist Who Will Admit the Patient for the  New Onset Atrial Flutter with RVR. ? ? ?Final Clinical Impression(s) / ED Diagnoses ?Final diagnoses:  ?Atrial flutter, unspecified type ([place])  ? ? ?Rx / DC Orders ?ED Discharge Orders   ? ? None  ? ?  ? ? ?  ?Fredia Sorrow, MD ?01/27/22 2237 ? ?

## 2022-01-27 NOTE — Progress Notes (Signed)
Plan of Care Note for accepted transfer ? ? ?Patient: Frederick Michael MRN: [id number]   [place]: 01/27/2022 ? ?Facility requesting transfer: [facility] ?Requesting Provider: Dr. Rogene Houston ?Reason for transfer: New onset atrial flutter ?Facility course: Patient is a 76 year old male with past medical history of hypertension, hyperlipidemia, hypothyroidism presented to the ED with complaints of funny feeling in his chest since yesterday, no chest pain.  Found to be in new onset atrial flutter with rate in the 130s.  Labs without any significant abnormalities.  COVID and flu negative.  Chest x-ray showing cardiomegaly and no acute finding.  He was started on Cardizem drip after which blood pressure dropped to the 90s but this was felt to be positional as after patient was repositioned in the bed and was given a fluid bolus blood pressure improved.  Cardizem drip continued. ? ?Plan of care: ?The patient is accepted for admission to Progressive unit, at [facility]..  ? ?Author: ?Shela Leff, MD ?01/27/2022 ? ?Check [email or web address] for on-call coverage. ? ?Nursing staff, Please call [place] number on Amion as soon as patient's arrival, so appropriate admitting provider can evaluate the pt. ?

## 2022-01-27 NOTE — ED Triage Notes (Signed)
Pt reports that he noticed that his heart was not normal yesterday around 2pm.  Pt states that he noticed especially when he went to bed last night.  Pt then checked his pulseox and saw HR was in 130's.  Pt repots that his usual HR is in the 50's, pt denies any CP or sob with this.  ?

## 2022-01-28 ENCOUNTER — Inpatient Hospital Stay (HOSPITAL_COMMUNITY): Payer: Medicare Other

## 2022-01-28 ENCOUNTER — Encounter (HOSPITAL_COMMUNITY): Payer: Self-pay | Admitting: Internal Medicine

## 2022-01-28 DIAGNOSIS — E7800 Pure hypercholesterolemia, unspecified: Secondary | ICD-10-CM | POA: Diagnosis present

## 2022-01-28 DIAGNOSIS — I119 Hypertensive heart disease without heart failure: Secondary | ICD-10-CM | POA: Diagnosis present

## 2022-01-28 DIAGNOSIS — E039 Hypothyroidism, unspecified: Secondary | ICD-10-CM | POA: Diagnosis present

## 2022-01-28 DIAGNOSIS — E119 Type 2 diabetes mellitus without complications: Secondary | ICD-10-CM | POA: Diagnosis not present

## 2022-01-28 DIAGNOSIS — I351 Nonrheumatic aortic (valve) insufficiency: Secondary | ICD-10-CM | POA: Diagnosis present

## 2022-01-28 DIAGNOSIS — Z20822 Contact with and (suspected) exposure to covid-19: Secondary | ICD-10-CM | POA: Diagnosis present

## 2022-01-28 DIAGNOSIS — I4891 Unspecified atrial fibrillation: Secondary | ICD-10-CM

## 2022-01-28 DIAGNOSIS — I088 Other rheumatic multiple valve diseases: Secondary | ICD-10-CM | POA: Diagnosis not present

## 2022-01-28 DIAGNOSIS — I4892 Unspecified atrial flutter: Secondary | ICD-10-CM | POA: Diagnosis present

## 2022-01-28 DIAGNOSIS — E1165 Type 2 diabetes mellitus with hyperglycemia: Secondary | ICD-10-CM | POA: Diagnosis present

## 2022-01-28 DIAGNOSIS — I483 Typical atrial flutter: Secondary | ICD-10-CM | POA: Diagnosis present

## 2022-01-28 DIAGNOSIS — I10 Essential (primary) hypertension: Secondary | ICD-10-CM | POA: Diagnosis present

## 2022-01-28 DIAGNOSIS — Z79899 Other long term (current) drug therapy: Secondary | ICD-10-CM | POA: Diagnosis not present

## 2022-01-28 DIAGNOSIS — Z87891 Personal history of nicotine dependence: Secondary | ICD-10-CM | POA: Diagnosis not present

## 2022-01-28 DIAGNOSIS — E785 Hyperlipidemia, unspecified: Secondary | ICD-10-CM | POA: Diagnosis not present

## 2022-01-28 DIAGNOSIS — I441 Atrioventricular block, second degree: Secondary | ICD-10-CM | POA: Diagnosis present

## 2022-01-28 DIAGNOSIS — Z79890 Hormone replacement therapy (postmenopausal): Secondary | ICD-10-CM | POA: Diagnosis not present

## 2022-01-28 LAB — ECHOCARDIOGRAM COMPLETE
AR max vel: 3.77 cm2
AV Area VTI: 3.84 cm2
AV Area mean vel: 3.43 cm2
AV Mean grad: 3 mmHg
AV Peak grad: 5.3 mmHg
Ao pk vel: 1.15 m/s
Calc EF: 57 %
Height: 75 in
P 1/2 time: 624 msec
S' Lateral: 2.3 cm
Single Plane A2C EF: 66.2 %
Single Plane A4C EF: 46 %
Weight: 3312 oz

## 2022-01-28 LAB — TSH
TSH: 10.614 u[IU]/mL — ABNORMAL HIGH (ref 0.350–4.500)
TSH: 8.764 u[IU]/mL — ABNORMAL HIGH (ref 0.350–4.500)

## 2022-01-28 LAB — MRSA NEXT GEN BY PCR, NASAL: MRSA by PCR Next Gen: NOT DETECTED

## 2022-01-28 MED ORDER — PNEUMOCOCCAL 20-VAL CONJ VACC 0.5 ML IM SUSY
0.5000 mL | PREFILLED_SYRINGE | INTRAMUSCULAR | Status: DC
Start: 2022-01-29 — End: 2022-01-30
  Filled 2022-01-28: qty 0.5

## 2022-01-28 MED ORDER — ACETAMINOPHEN 325 MG PO TABS: 650.0000 mg | ORAL_TABLET | ORAL | Status: DC | PRN

## 2022-01-28 MED ORDER — HEPARIN (PORCINE) 25000 UT/250ML-% IV SOLN
1400.0000 [IU]/h | INTRAVENOUS | Status: DC
  Administered 2022-01-28: 1400 [IU]/h via INTRAVENOUS
  Filled 2022-01-28: qty 250

## 2022-01-28 MED ORDER — APIXABAN 5 MG PO TABS
5.0000 mg | ORAL_TABLET | Freq: Two times a day (BID) | ORAL | Status: DC
Start: 2022-01-28 — End: 2022-01-30
  Administered 2022-01-28 – 2022-01-30 (×5): 5 mg via ORAL
  Filled 2022-01-28 (×5): qty 1

## 2022-01-28 MED ORDER — DILTIAZEM HCL 60 MG PO TABS
60.0000 mg | ORAL_TABLET | Freq: Four times a day (QID) | ORAL | Status: DC
  Administered 2022-01-28 – 2022-01-29 (×6): 60 mg via ORAL
  Filled 2022-01-28 (×6): qty 1

## 2022-01-28 MED ORDER — HEPARIN BOLUS VIA INFUSION
4500.0000 [IU] | Freq: Once | INTRAVENOUS | Status: AC
  Administered 2022-01-28: 4500 [IU] via INTRAVENOUS
  Filled 2022-01-28: qty 4500

## 2022-01-28 MED ORDER — ONDANSETRON HCL 4 MG/2ML IJ SOLN: 4.0000 mg | Freq: Four times a day (QID) | INTRAMUSCULAR | Status: DC | PRN

## 2022-01-28 NOTE — Assessment & Plan Note (Signed)
BP soft after cardizem ?Holding home BP meds, if BP rises use nodal blocking agents for rate control. ?

## 2022-01-28 NOTE — Consult Note (Addendum)
?Cardiology Consultation:  ? ?Patient ID: Frederick Michael ?MRN: [id number]; DOB: June 03, 1946 ? ?Admit date: 01/27/2022 ?Date of Consult: 01/28/2022 ? ?PCP:  Haywood Pao, MD ?  ?[facility] Providers ?Cardiologist:  None      ? ? ?Patient Profile:  ? ?Frederick Michael is a 76 y.o. male with a hx of DM2, HTN, HLD hypothyroidism now admitted 01/28/22 with rapid heart rate and found to be in atrial fib who is being seen 01/28/2022 for the evaluation of atrial fib at the request of Dr. Sloan Leiter. ? ?History of Present Illness:  ? ?Frederick Michael with hx as above presented to ER yesterday with palpitations.  No chest pain.  No prior cardiac hx no hx of a fib or flutter.   ? ?EKG:  The EKG was personally reviewed and demonstrates:  atrial flutter 2:1  HR 136 ST deeper T waves due to flutter waves ?Telemetry:  Telemetry was personally reviewed and demonstrates:  a flutter HR 97 to 110 ? ?Na 136 K+ 4.0 BUN 29 Cr 1.29  TSH is pending.  ?WBC 8.0 Hgb 16.3 plts 234 ?IMPRESSION: ?Cardiomegaly without acute abnormality of the lungs in AP portable projection ? ?No hx of GI bleed, no syncope no hx of sleep apnea and never been told he snores. ? ?Was on heparin and now eliquis IV dilt changed to po dilt.   BP 103/78 P 98 R 13.   No chest pain ? ?Past Medical History:  ?Diagnosis Date  ? DM2 (diabetes mellitus, type 2) ([place])   ? High cholesterol   ? Hypertension   ? Thyroid disease   ? ? ?Past Surgical History:  ?Procedure Laterality Date  ? ELBOW SURGERY    ?  ? ?Home Medications:  ?Prior to Admission medications   ?Medication Sig Start Date End Date Taking? Authorizing Provider  ?Camphor-Menthol-Methyl Sal (SALONPAS EX) Apply 1 patch topically daily as needed (pain).   Yes [provider]  ?hydrochlorothiazide (HYDRODIURIL) 12.5 MG tablet Take 12.5 mg by mouth daily. 11/17/21  Yes [provider]  ?ibuprofen (ADVIL) 200 MG tablet Take 400 mg by mouth every 6 (six) hours as needed for headache.   Yes [provider]  ?levothyroxine (SYNTHROID) 150 MCG tablet Take 150 mcg by mouth daily. 12/20/21  Yes [provider]  ?lisinopril (ZESTRIL) 40 MG tablet Take 40 mg by mouth daily. 11/08/21  Yes [provider]  ?pravastatin (PRAVACHOL) 80 MG tablet Take 80 mg by mouth at bedtime. 01/23/22  Yes [provider]  ? ? ?Inpatient Medications: ?Scheduled Meds: ? apixaban  5 mg Oral BID  ? diltiazem  60 mg Oral Q6H  ? [START ON 01/29/2022] pneumococcal 20-valent conjugate vaccine  0.5 mL Intramuscular Tomorrow-1000  ? ?Continuous Infusions: ? sodium chloride 100 mL/hr at 01/28/22 1093  ? ?PRN Meds: ?acetaminophen, ondansetron (ZOFRAN) IV ? ?Allergies:   No Known Allergies ? ?Social History:   ?Social History  ? ?Socioeconomic History  ? Marital status: Married  ?  Spouse name: Not on file  ? Number of children: Not on file  ? Years of education: Not on file  ? Highest education level: Not on file  ?Occupational History  ? Not on file  ?Tobacco Use  ? Smoking status: Former  ?  Types: Cigarettes  ? Smokeless tobacco: Former  ?Substance and Sexual Activity  ? Alcohol use: Yes  ? Drug use: Never  ? Sexual activity: Not on file  ?Other Topics Concern  ? Not  on file  ?Social History Narrative  ? Not on file  ? ?Social Determinants of Health  ? ?Financial Resource Strain: Not on file  ?Food Insecurity: Not on file  ?Transportation Needs: Not on file  ?Physical Activity: Not on file  ?Stress: Not on file  ?Social Connections: Not on file  ?Intimate Partner Violence: Not on file  ?  ?Family History:   ?Mother died at 34.  ?Family History  ?Problem Relation Age of Onset  ? Cancer Father   ?  ? ?ROS:  ?Please see the history of present illness.  ?General:no colds or fevers, no weight changes ?Skin:no rashes or ulcers ?HEENT:no blurred vision, no congestion ?CV:see HPI ?PUL:see HPI ?GI:no diarrhea constipation or melena, no indigestion ?GU:no hematuria, no dysuria ?MS:no joint pain, no claudication ?Neuro:no  syncope, no lightheadedness ?Endo:no diabetes, + thyroid disease ? ?All other ROS reviewed and negative.    ? ?Physical Exam/Data:  ? ?Vitals:  ? 01/28/22 0145 01/28/22 0200 01/28/22 0419 01/28/22 0727  ?BP:  115/63 96/70 113/72  ?Pulse:  (!) 52 66 60  ?Resp: '20 18 18 17  '$ ?Temp:   97.7 ?F (36.5 ?C) 97.6 ?F (36.4 ?C)  ?TempSrc:   Oral Oral  ?SpO2:  95% 93% 94%  ?Weight:      ?Height:      ? ? ?Intake/Output Summary (Last 24 hours) at 01/28/2022 1601 ?Last data filed at 01/28/2022 0730 ?Gross per 24 hour  ?Intake --  ?Output 300 ml  ?Net -300 ml  ? ?Last 3 Weights 01/27/2022  ?Weight (lbs) 207 lb  ?Weight (kg) 93.895 kg  ?   ?Body mass index is 25.87 kg/m?.  ?General:  Well nourished, well developed, in no acute distress ?HEENT: normal ?Neck: no JVD ?Vascular: No carotid bruits; Distal pulses 2+ bilaterally ?Cardiac:  irreg irreg; no murmur gallup rub or click ?Lungs:  clear to auscultation bilaterally, no wheezing, rhonchi or rales  ?Abd: soft, nontender, no hepatomegaly  ?Ext: no edema ?Musculoskeletal:  No deformities, BUE and BLE strength normal and equal ?Skin: warm and dry  ?Neuro:  alert and oriented X 3 MAE follows commands, no focal abnormalities noted ?Psych:  Normal affect  ? ? ?Relevant CV Studies: ?Echo pending ? ?Laboratory Data: ? ?High Sensitivity Troponin:  No results for input(s): TROPONINIHS in the last 720 hours.   ?Chemistry ?Recent Labs  ?Lab 01/27/22 ?2001  ?NA 136  ?K 4.0  ?CL 100  ?CO2 25  ?GLUCOSE 169*  ?BUN 29*  ?CREATININE 1.29*  ?CALCIUM 10.5*  ?GFRNONAA 58*  ?ANIONGAP 11  ?  ?Recent Labs  ?Lab 01/27/22 ?2101  ?PROT 7.0  ?ALBUMIN 4.0  ?AST 23  ?ALT 30  ?ALKPHOS 36*  ?BILITOT 0.5  ? ?Lipids No results for input(s): CHOL, TRIG, HDL, LABVLDL, LDLCALC, CHOLHDL in the last 168 hours.  ?Hematology ?Recent Labs  ?Lab 01/27/22 ?2001 01/27/22 ?2101  ?WBC 9.0 8.0  ?RBC 5.86* 5.61  ?HGB 17.3* 16.3  ?HCT 52.2* 49.3  ?MCV 89.1 87.9  ?MCH 29.5 29.1  ?MCHC 33.1 33.1  ?RDW 13.8 13.8  ?PLT 258 234  ? ?Thyroid  No results for input(s): TSH, FREET4 in the last 168 hours.  ?BNPNo results for input(s): BNP, PROBNP in the last 168 hours.  ?DDimer No results for input(s): DDIMER in the last 168 hours. ? ? ?Radiology/Studies:  ?DG Chest Port 1 View ? ?Result Date: 01/27/2022 ?CLINICAL DATA:  Tachycardia EXAM: PORTABLE CHEST 1 VIEW COMPARISON:  None. FINDINGS: Cardiomegaly. Both lungs are clear.  The visualized skeletal structures are unremarkable. IMPRESSION: Cardiomegaly without acute abnormality of the lungs in AP portable projection. Electronically Signed   By: Delanna Ahmadi M.D.   On: 01/27/2022 20:36   ? ? ?Assessment and Plan:  ? ?Atrial flutter with RVR, now on eliquis and dilt po.  HR 97 to 110 plan for TEE DCCV in AM.  Echo is pending. No troponins done and no chest pain.  TSH is in process.   CHa2DS2VASc of 3  ?Cardiomegaly on CXR and echo is pending.  ?HTN controlled home meds zestril and hydrodiuril. May need to stop hydrodiuril added for HTN not edema ?HLD on Pravachol. Followed by PCP ? ?[facility] has been requested to perform a transesophageal echocardiogram on Frederick Michael for atrial flutter.  After careful review of history and examination, the risks and benefits of transesophageal echocardiogram have been explained including risks of esophageal damage, perforation (1:10,000 risk), bleeding, pharyngeal hematoma as well as other potential complications associated with conscious sedation including aspiration, arrhythmia, respiratory failure and death. Alternatives to treatment were discussed, questions were answered. Patient is willing to proceed.  ? ?Cecilie Kicks, NP  ?01/28/2022 9:22 AM   ? ?Risk Assessment/Risk Scores:  ?   ?  ?  ? ?CHA2DS2-VASc Score = 3  ? This indicates a 3.2% annual risk of stroke. ?The patient's score is based upon: ?CHF History: 0 ?HTN History: 0 ?Diabetes History: 1 ?Stroke History: 0 ?Vascular Disease History: 0 ?Age Score: 2 ?Gender Score: 0 ?  ? ? ?   ? ?For questions or  updates, please contact [place] ?Please consult [email or web address] for contact info under  ? ? ?Signed, ?Cecilie Kicks, NP  ?01/28/2022 9:22 AM ? ?I have seen and examined the patient along with Cecilie Kicks,

## 2022-01-28 NOTE — Progress Notes (Signed)
?  Transition of Care (TOC) Screening Note ? ? ?Patient Details  ?Name: Frederick Michael ?Date of Birth: Oct 15, 1946 ? ? ?Transition of Care (TOC) CM/SW Contact:    ?Pollie Friar, RN ?Phone Number: ?01/28/2022, 10:23 AM ? ? ? ?Transition of Care Department [facility]) has reviewed patient. We will continue to monitor patient advancement through interdisciplinary progression rounds. If new patient transition needs arise, please place a TOC consult. ?  ?

## 2022-01-28 NOTE — Assessment & Plan Note (Addendum)
1. A.fib pathway ?2. Check TSH given h/o hypothyroidism ?3. CHADS-VASC = 4 ?1. Heparin gtt ?4. Rate control with cardizem gtt ?1. Initially BP soft after cardizem, but improved now after IVF bolus ?2. Will leave on NS at 100 overnight ?3. Monitor for further hypotension ?5. Tele monitor ?6. 2d echo ?

## 2022-01-28 NOTE — Progress Notes (Signed)
76 year old gentleman with history of type 2 diabetes, hypertension hyperlipidemia and hypothyroidism presented with sudden onset of episodic chest fluttering and found to have 2-1 A-flutter in the ER with heart rate as high as 140.  Admitted with heparin infusion and diltiazem drip.  Onset of symptoms less than 48 hours.  May benefit with TEE cardioversion.  Cardiology consulted.  Currently hemodynamically stable. ? ?Patient is observation, will benefit with hospitalization.  Changed to inpatient as patient on injectable drips, heart rate control as well as inpatient procedure planned. ? ?Same-day admission.  No charge visit. ?

## 2022-01-28 NOTE — Assessment & Plan Note (Signed)
Resume synthroid when med rec completed unless TSH is low. ?

## 2022-01-28 NOTE — Assessment & Plan Note (Signed)
Med rec pending ?

## 2022-01-28 NOTE — H&P (Signed)
?History and Physical  ? ? ?Patient: Frederick Michael GYI:[id number] DOB: 1946/04/21 ?DOA: 01/27/2022 ?DOS: the patient was seen and examined on 01/28/2022 ?PCP: Haywood Pao, MD  ?Patient coming from: Home ? ?Chief Complaint:  ?Chief Complaint  ?Patient presents with  ? Tachycardia  ? ?HPI: Frederick Michael is a 76 y.o. male with medical history significant of DM2, HTN, HLD, hypothyroidism. ? ?Pt presents to ED with c/o palpitations.  Onset yesterday.  No CP. ? ?Had "strange" feeling in chest.  Took HR, was in 130s.  Normally in 51s.  So came in to ED. ? ?No h/o A.Fib/flutter in past. ? ?No recent COVID ? ?COVID vaccine last given in Sept. ? ?On lisinopril for HTN.  Synthroid. ? ?Mild lightheadedness with standing. ?  ?Review of Systems: As mentioned in the history of present illness. All other systems reviewed and are negative. ?Past Medical History:  ?Diagnosis Date  ? DM2 (diabetes mellitus, type 2) ([place])   ? High cholesterol   ? Hypertension   ? Thyroid disease   ? ?Past Surgical History:  ?Procedure Laterality Date  ? ELBOW SURGERY    ? ?Social History:  reports that he has quit smoking. His smoking use included cigarettes. He has quit using smokeless tobacco. He reports current alcohol use. He reports that he does not use drugs. ? ?No Known Allergies ? ?History reviewed. No pertinent family history. ? ?Prior to Admission medications   ?Not on File  ? ? ?Physical Exam: ?Vitals:  ? 01/28/22 0015 01/28/22 0030 01/28/22 0135 01/28/22 0140  ?BP: 113/66 107/68 102/65 102/65  ?Pulse: 71  93 93  ?Resp: 18 (!) 22  (!) 26  ?Temp:   (!) 97.4 ?F (36.3 ?C) (!) 97.4 ?F (36.3 ?C)  ?TempSrc:   Oral Oral  ?SpO2: 96% 94%    ?Weight:      ?Height:      ? ?Constitutional: NAD, calm, comfortable ?Eyes: PERRL, lids and conjunctivae normal ?ENMT: Mucous membranes are moist. Posterior pharynx clear of any exudate or lesions.Normal dentition.  ?Neck: normal, supple, no masses, no thyromegaly ?Respiratory: clear to auscultation  bilaterally, no wheezing, no crackles. Normal respiratory effort. No accessory muscle use.  ?Cardiovascular: IRR, IRR ?Abdomen: no tenderness, no masses palpated. No hepatosplenomegaly. Bowel sounds positive.  ?Musculoskeletal: no clubbing / cyanosis. No joint deformity upper and lower extremities. Good ROM, no contractures. Normal muscle tone.  ?Skin: no rashes, lesions, ulcers. No induration ?Neurologic: CN 2-12 grossly intact. Sensation intact, DTR normal. Strength 5/5 in all 4.  ?Psychiatric: Normal judgment and insight. Alert and oriented x 3. Normal mood.   ?Data Reviewed: ? ?Creat 1.29 up slightly from 1.1 in Jan this year.  BUN of 29 today is barely changed from 24 in Jan this year. ?EKG = A.flutter rate 130, 2:1 ? ?Now rate controlled a.flutter on monitor. ? ? ?Assessment and Plan: ?* Atrial flutter with rapid ventricular response ([facility]) ?A.fib pathway ?Check TSH given h/o hypothyroidism ?CHADS-VASC = 4 ?Heparin gtt ?Rate control with cardizem gtt ?Initially BP soft after cardizem, but improved now after IVF bolus ?Will leave on NS at 100 overnight ?Monitor for further hypotension ?Tele monitor ?2d echo ? ?Hypothyroidism ?Resume synthroid when med rec completed unless TSH is low. ? ?HLD (hyperlipidemia) ?Med rec pending ? ?DM2 (diabetes mellitus, type 2) ([place]) ?Med rec pending ? ?HTN (hypertension) ?BP soft after cardizem ?Holding home BP meds, if BP rises use nodal blocking agents for rate control. ? ? ? ? ? Advance  Care Planning:   Code Status: Full Code  ? ?Consults: None ? ?Family Communication: None ? ?Severity of Illness: ?The appropriate patient status for this patient is OBSERVATION. Observation status is judged to be reasonable and necessary in order to provide the required intensity of service to ensure the patient's safety. The patient's presenting symptoms, physical exam findings, and initial radiographic and laboratory data in the context of their medical condition is felt to place them at  decreased risk for further clinical deterioration. Furthermore, it is anticipated that the patient will be medically stable for discharge from the hospital within 2 midnights of admission.  ? ?Author: ?Etta Quill., DO ?01/28/2022 1:59 AM ? ?For on call review [email or web address].  ?

## 2022-01-28 NOTE — Plan of Care (Signed)
?  Problem: Education: ?Goal: Knowledge of General Education information will improve ?Description: Including pain rating scale, medication(s)/side effects and non-pharmacologic comfort measures ?Outcome: Progressing ?  ?Problem: Health Behavior/Discharge Planning: ?Goal: Ability to manage health-related needs will improve ?Outcome: Progressing ?  ?Problem: Clinical Measurements: ?Goal: Ability to maintain clinical measurements within normal limits will improve ?Outcome: Progressing ?  ?Problem: Education: ?Goal: Knowledge of disease or condition will improve ?Outcome: Progressing ?Goal: Understanding of medication regimen will improve ?Outcome: Progressing ?  ?

## 2022-01-28 NOTE — Plan of Care (Signed)
?  Problem: Education: ?Goal: Knowledge of General Education information will improve ?Description: Including pain rating scale, medication(s)/side effects and non-pharmacologic comfort measures ?Outcome: Progressing ?  ?Problem: Clinical Measurements: ?Goal: Cardiovascular complication will be avoided ?Outcome: Progressing ?  ?Problem: Activity: ?Goal: Risk for activity intolerance will decrease ?Outcome: Progressing ?  ?Problem: Activity: ?Goal: Ability to tolerate increased activity will improve ?Outcome: Progressing ?  ?

## 2022-01-28 NOTE — Progress Notes (Signed)
ANTICOAGULATION CONSULT NOTE - Follow Up Consult ? ?Pharmacy Consult for heparin ?Indication: atrial fibrillation ? ?No Known Allergies ? ?Patient Measurements: ?Height: '6\' 3"'$  (190.5 cm) ?Weight: 93.9 kg (207 lb) ?IBW/kg (Calculated) : 84.5 ?Heparin Dosing Weight: 94 kg ? ?Vital Signs: ?Temp: 97.4 ?F (36.3 ?C) (03/19 0140) ?Temp Source: Oral (03/19 0140) ?BP: 102/65 (03/19 0140) ?Pulse Rate: 93 (03/19 0140) ? ?Labs: ?Recent Labs  ?  01/27/22 ?2001 01/27/22 ?2101  ?HGB 17.3* 16.3  ?HCT 52.2* 49.3  ?PLT 258 234  ?CREATININE 1.29*  --   ? ? ?Estimated Creatinine Clearance: 59.1 mL/min (A) (by C-G formula based on SCr of 1.29 mg/dL (H)). ? ? ?Assessment: ?Admitted to the ED with 'strange feeling in chest'. Patient diagnosed with new onset atrial flutter. Patient not taking anticoagulation at home. Pharmacy consulted for heparin management.  ? ? ?Goal of Therapy:  ?Heparin level 0.3-0.7 units/ml ?Monitor platelets by anticoagulation protocol: Yes ?  ?Plan:  ?Give 4500 units bolus x 1 ?Start heparin infusion at 1400 units/hr ?Check anti-Xa level in 8 hours ?Continue to monitor H&H and platelets ? ?Carma Lair, PharmD Candidate 2023 ?01/28/2022,1:55 AM ? ? ?

## 2022-01-28 NOTE — Progress Notes (Signed)
? ?  Echocardiogram ?2D Echocardiogram has been performed. ? ?Frederick Michael ?01/28/2022, 2:55 PM ?

## 2022-01-29 ENCOUNTER — Other Ambulatory Visit (HOSPITAL_COMMUNITY): Payer: Self-pay

## 2022-01-29 DIAGNOSIS — I4892 Unspecified atrial flutter: Secondary | ICD-10-CM | POA: Diagnosis not present

## 2022-01-29 LAB — BASIC METABOLIC PANEL
Anion gap: 6 (ref 5–15)
BUN: 19 mg/dL (ref 8–23)
CO2: 22 mmol/L (ref 22–32)
Calcium: 8.3 mg/dL — ABNORMAL LOW (ref 8.9–10.3)
Chloride: 108 mmol/L (ref 98–111)
Creatinine, Ser: 1.14 mg/dL (ref 0.61–1.24)
GFR, Estimated: >60 mL/min (ref >60)
Glucose, Bld: 125 mg/dL — ABNORMAL HIGH (ref 70–99)
Potassium: 3.9 mmol/L (ref 3.5–5.1)
Sodium: 136 mmol/L (ref 135–145)

## 2022-01-29 LAB — GLUCOSE, CAPILLARY
Glucose-Capillary: 144 mg/dL — ABNORMAL HIGH (ref 70–99)
Glucose-Capillary: 141 mg/dL — ABNORMAL HIGH (ref 70–99)
Glucose-Capillary: 178 mg/dL — ABNORMAL HIGH (ref 70–99)
Glucose-Capillary: 123 mg/dL — ABNORMAL HIGH (ref 70–99)

## 2022-01-29 LAB — CBC WITH DIFFERENTIAL/PLATELET
Abs Immature Granulocytes: 0.03 10*3/uL (ref 0.00–0.07)
Basophils Absolute: 0 10*3/uL (ref 0.0–0.1)
Basophils Relative: 1 %
Eosinophils Absolute: 0.1 10*3/uL (ref 0.0–0.5)
Eosinophils Relative: 2 %
HCT: 44.5 % (ref 39.0–52.0)
Hemoglobin: 15.2 g/dL (ref 13.0–17.0)
Immature Granulocytes: 0 %
Lymphocytes Relative: 28 %
Lymphs Abs: 2.1 10*3/uL (ref 0.7–4.0)
MCH: 30.8 pg (ref 26.0–34.0)
MCHC: 34.2 g/dL (ref 30.0–36.0)
MCV: 90.1 fL (ref 80.0–100.0)
Monocytes Absolute: 0.9 10*3/uL (ref 0.1–1.0)
Monocytes Relative: 12 %
Neutro Abs: 4.1 10*3/uL (ref 1.7–7.7)
Neutrophils Relative %: 57 %
Platelets: 185 10*3/uL (ref 150–400)
RBC: 4.94 MIL/uL (ref 4.22–5.81)
RDW: 13.7 % (ref 11.5–15.5)
WBC: 7.2 10*3/uL (ref 4.0–10.5)
nRBC: 0 % (ref 0.0–0.2)

## 2022-01-29 LAB — LIPID PANEL
Cholesterol: 125 mg/dL (ref 0–200)
HDL: 25 mg/dL — ABNORMAL LOW (ref >40)
LDL Cholesterol: 78 mg/dL (ref 0–99)
Total CHOL/HDL Ratio: 5 RATIO
Triglycerides: 110 mg/dL (ref <150)
VLDL: 22 mg/dL (ref 0–40)

## 2022-01-29 LAB — HEMOGLOBIN A1C
Hgb A1c MFr Bld: 7.3 % — ABNORMAL HIGH (ref 4.8–5.6)
Mean Plasma Glucose: 162.81 mg/dL

## 2022-01-29 LAB — MAGNESIUM: Magnesium: 1.6 mg/dL — ABNORMAL LOW (ref 1.7–2.4)

## 2022-01-29 LAB — T4, FREE: Free T4: 0.91 ng/dL (ref 0.61–1.12)

## 2022-01-29 MED ORDER — MAGNESIUM SULFATE 2 GM/50ML IV SOLN
2.0000 g | Freq: Once | INTRAVENOUS | Status: AC
  Administered 2022-01-29: 2 g via INTRAVENOUS
  Filled 2022-01-29: qty 50

## 2022-01-29 MED ORDER — DILTIAZEM HCL 60 MG PO TABS
30.0000 mg | ORAL_TABLET | Freq: Four times a day (QID) | ORAL | Status: DC
  Administered 2022-01-29 – 2022-01-30 (×2): 30 mg via ORAL
  Filled 2022-01-29 (×2): qty 1

## 2022-01-29 MED ORDER — SODIUM CHLORIDE 0.9 % IV SOLN: INTRAVENOUS | Status: DC

## 2022-01-29 MED ORDER — LEVOTHYROXINE SODIUM 75 MCG PO TABS: 150.0000 ug | ORAL_TABLET | Freq: Every day | ORAL | Status: DC

## 2022-01-29 NOTE — Progress Notes (Signed)
Mobility Specialist Progress Note  ? ? 01/29/22 1300  ?Mobility  ?Activity Ambulated independently in hallway  ?Level of Assistance Independent  ?Assistive Device None  ?Distance Ambulated (ft) 800 ft  ?Activity Response Tolerated well  ?$Mobility charge 1 Mobility  ? ?Pre-Mobility: 93 HR, 93% SpO2 ?Post-Mobility: 99 HR ? ?Pt received in bed and agreeable. No complaints. Returned to bed with call bell in reach.  ? ?Hildred Alamin ?Mobility Specialist  ?  ?

## 2022-01-29 NOTE — Discharge Instructions (Signed)
Information on my medicine - ELIQUIS? (apixaban) ? ?This medication education was reviewed with me or my healthcare representative as part of my discharge preparation.  The pharmacist that spoke with me during my hospital stay was:  Franky Macho, [facility] ? ?Why was Eliquis? prescribed for you? ?Eliquis? was prescribed for you to reduce the risk of a blood clot forming that can cause a stroke if you have a medical condition called atrial fibrillation (a type of irregular heartbeat). ? ?What do You need to know about Eliquis? ? ?Take your Eliquis? TWICE DAILY - one tablet in the morning and one tablet in the evening with or without food. If you have difficulty swallowing the tablet whole please discuss with your pharmacist how to take the medication safely. ? ?Take Eliquis? exactly as prescribed by your doctor and DO NOT stop taking Eliquis? without talking to the doctor who prescribed the medication.  Stopping may increase your risk of developing a stroke.  Refill your prescription before you run out. ? ?After discharge, you should have regular check-up appointments with your healthcare provider that is prescribing your Eliquis?.  In the future your dose may need to be changed if your kidney function or weight changes by a significant amount or as you get older. ? ?What do you do if you miss a dose? ?If you miss a dose, take it as soon as you remember on the same day and resume taking twice daily.  Do not take more than one dose of ELIQUIS at the same time to make up a missed dose. ? ?Important Safety Information ?A possible side effect of Eliquis? is bleeding. You should call your healthcare provider right away if you experience any of the following: ?Bleeding from an injury or your nose that does not stop. ?Unusual colored urine (red or dark brown) or unusual colored stools (red or black). ?Unusual bruising for unknown reasons. ?A serious fall or if you hit your head (even if there is no bleeding). ? ?Some medicines  may interact with Eliquis? and might increase your risk of bleeding or clotting while on Eliquis?Marland Kitchen To help avoid this, consult your healthcare provider or pharmacist prior to using any new prescription or non-prescription medications, including herbals, vitamins, non-steroidal anti-inflammatory drugs (NSAIDs) and supplements. ? ?This website has more information on Eliquis? (apixaban): http://www.eliquis.com/eliquis/home  ?

## 2022-01-29 NOTE — Progress Notes (Signed)
Progress Note  Patient Name: Frederick Michael Date of Encounter: 01/29/2022  [facility] Cardiologist: Thurmon Fair, MD    Subjective   76 year old gentleman with a history of diabetes, hypertension, hyperlipidemia, hypothyroidism is found to have atrial flutter.  His rate is currently well controlled.  He has been on Eliquis.  He is scheduled for TEE cardioversion tomorrow.  We discussed the procedure and have answered all of his questions.  We discussed the risk, benefits, options.  He understands and agrees to proceed.  He should be able to be discharged tomorrow .   We will consid referring him to electrophysiology for consideration of A flutter ablation.  Inpatient Medications    Scheduled Meds:  apixaban  5 mg Oral BID   diltiazem  60 mg Oral Q6H   levothyroxine  150 mcg Oral Q0600   pneumococcal 20-valent conjugate vaccine  0.5 mL Intramuscular Tomorrow-1000   Continuous Infusions:  PRN Meds: acetaminophen, ondansetron (ZOFRAN) IV   Vital Signs    Vitals:   01/29/22 0418 01/29/22 0506 01/29/22 0755 01/29/22 1048  BP: 101/65 108/62 97/67 (!) 132/94  Pulse: 63  69 81  Resp: 18  20 19   Temp: (!) 97.5 F (36.4 C)  97.6 F (36.4 C)   TempSrc: Oral     SpO2: 94%   94%  Weight:      Height:        Intake/Output Summary (Last 24 hours) at 01/29/2022 1103 Last data filed at 01/29/2022 1048 Gross per 24 hour  Intake --  Output 1350 ml  Net -1350 ml   Last 3 Weights 01/27/2022  Weight (lbs) 207 lb  Weight (kg) 93.895 kg      Telemetry    Atrial flutter with controlled V rate.  - Personally Reviewed  ECG     - Personally Reviewed  Physical Exam   GEN: No acute distress.   Neck: No JVD Cardiac: RRR, no murmurs, rubs, or gallops.  Respiratory: Clear to auscultation bilaterally. GI: Soft, nontender, non-distended  MS: No edema; No deformity. Neuro:  Nonfocal  Psych: Normal affect   Labs    High Sensitivity Troponin:  No results for  input(s): TROPONINIHS in the last 720 hours.   Chemistry Recent Labs  Lab 01/27/22 2001 01/27/22 2101 01/29/22 0031  NA 136  --  136  K 4.0  --  3.9  CL 100  --  108  CO2 25  --  22  GLUCOSE 169*  --  125*  BUN 29*  --  19  CREATININE 1.29*  --  1.14  CALCIUM 10.5*  --  8.3*  MG  --   --  1.6*  PROT  --  7.0  --   ALBUMIN  --  4.0  --   AST  --  23  --   ALT  --  30  --   ALKPHOS  --  36*  --   BILITOT  --  0.5  --   GFRNONAA 58*  --  >60  ANIONGAP 11  --  6    Lipids  Recent Labs  Lab 01/29/22 0031  CHOL 125  TRIG 110  HDL 25*  LDLCALC 78  CHOLHDL 5.0    Hematology Recent Labs  Lab 01/27/22 2001 01/27/22 2101 01/29/22 0031  WBC 9.0 8.0 7.2  RBC 5.86* 5.61 4.94  HGB 17.3* 16.3 15.2  HCT 52.2* 49.3 44.5  MCV 89.1 87.9 90.1  MCH 29.5 29.1 30.8  MCHC  33.1 33.1 34.2  RDW 13.8 13.8 13.7  PLT 258 234 185   Thyroid  Recent Labs  Lab 01/28/22 1119 01/29/22 0031  TSH 8.764*  --   FREET4  --  0.91    BNPNo results for input(s): BNP, PROBNP in the last 168 hours.  DDimer No results for input(s): DDIMER in the last 168 hours.   Radiology    DG Chest Port 1 View  Result Date: 01/27/2022 CLINICAL DATA:  Tachycardia EXAM: PORTABLE CHEST 1 VIEW COMPARISON:  None. FINDINGS: Cardiomegaly. Both lungs are clear. The visualized skeletal structures are unremarkable. IMPRESSION: Cardiomegaly without acute abnormality of the lungs in AP portable projection. Electronically Signed   By: Jearld Lesch M.D.   On: 01/27/2022 20:36   ECHOCARDIOGRAM COMPLETE  Result Date: 01/28/2022    ECHOCARDIOGRAM REPORT   Patient Name:   Frederick Michael [facility] Date of Exam: 01/28/2022 Medical Rec #:  [id number]          Height:       75.0 in Accession #:    [id number]         Weight:       207.0 lb Date of Birth:  1946-04-02          BSA:          2.227 m Patient Age:    76 years           BP: Patient Gender: M                  HR:           88 bpm. Exam Location:  Inpatient Procedure: 2D Echo,  Cardiac Doppler and Color Doppler Indications:    Atrial flutter  History:        Patient has no prior history of Echocardiogram examinations.                 Arrythmias:Atrial Flutter.  Sonographer:    Festus Barren Referring Phys: [phone] JARED M GARDNER IMPRESSIONS  1. Left ventricular ejection fraction, by estimation, is 60 to 65%. The left ventricle has normal function. The left ventricle has no regional wall motion abnormalities. There is mild left ventricular hypertrophy of the basal-septal segment. Left ventricular diastolic function could not be evaluated.  2. Right ventricular systolic function is normal. The right ventricular size is mildly enlarged.  3. Left atrial size was mildly dilated.  4. The mitral valve is normal in structure. No evidence of mitral valve regurgitation. No evidence of mitral stenosis.  5. The aortic valve is normal in structure. Aortic valve regurgitation is not visualized. No aortic stenosis is present.  6. The inferior vena cava is normal in size with greater than 50% respiratory variability, suggesting right atrial pressure of 3 mmHg. FINDINGS  Left Ventricle: Left ventricular ejection fraction, by estimation, is 60 to 65%. The left ventricle has normal function. The left ventricle has no regional wall motion abnormalities. The left ventricular internal cavity size was normal in size. There is  mild left ventricular hypertrophy of the basal-septal segment. Left ventricular diastolic function could not be evaluated due to atrial fibrillation. Left ventricular diastolic function could not be evaluated. Right Ventricle: The right ventricular size is mildly enlarged. No increase in right ventricular wall thickness. Right ventricular systolic function is normal. Left Atrium: Left atrial size was mildly dilated. Right Atrium: Right atrial size was normal in size. Pericardium: There is no evidence of pericardial effusion. Mitral Valve: The mitral valve is normal  in structure. No evidence  of mitral valve regurgitation. No evidence of mitral valve stenosis. Tricuspid Valve: The tricuspid valve is normal in structure. Tricuspid valve regurgitation is not demonstrated. No evidence of tricuspid stenosis. Aortic Valve: The aortic valve is normal in structure. Aortic valve regurgitation is not visualized. Aortic regurgitation PHT measures 624 msec. No aortic stenosis is present. Aortic valve mean gradient measures 3.0 mmHg. Aortic valve peak gradient measures 5.3 mmHg. Aortic valve area, by VTI measures 3.84 cm. Pulmonic Valve: The pulmonic valve was normal in structure. Pulmonic valve regurgitation is not visualized. No evidence of pulmonic stenosis. Aorta: The aortic root is normal in size and structure. Venous: The inferior vena cava is normal in size with greater than 50% respiratory variability, suggesting right atrial pressure of 3 mmHg. IAS/Shunts: No atrial level shunt detected by color flow Doppler.  LEFT VENTRICLE PLAX 2D LVIDd:         4.60 cm LVIDs:         2.30 cm LV PW:         1.10 cm LV IVS:        1.30 cm LVOT diam:     2.20 cm LV SV:         77 LV SV Index:   34 LVOT Area:     3.80 cm  LV Volumes (MOD) LV vol d, MOD A2C: 63.7 ml LV vol d, MOD A4C: 81.0 ml LV vol s, MOD A2C: 21.5 ml LV vol s, MOD A4C: 43.7 ml LV SV MOD A2C:     42.2 ml LV SV MOD A4C:     81.0 ml LV SV MOD BP:      42.0 ml RIGHT VENTRICLE RV S prime:     11.90 cm/s TAPSE (M-mode): 1.5 cm LEFT ATRIUM             Index        RIGHT ATRIUM           Index LA diam:        3.90 cm 1.75 cm/m   RA Area:     12.00 cm LA Vol (A2C):   36.6 ml 16.44 ml/m  RA Volume:   23.70 ml  10.64 ml/m LA Vol (A4C):   42.6 ml 19.13 ml/m LA Biplane Vol: 40.8 ml 18.32 ml/m  AORTIC VALVE                    PULMONIC VALVE AV Area (Vmax):    3.77 cm     PV Vmax:       0.70 m/s AV Area (Vmean):   3.43 cm     PV Vmean:      42.600 cm/s AV Area (VTI):     3.84 cm     PV VTI:        0.119 m AV Vmax:           115.00 cm/s  PV Peak grad:  1.9 mmHg  AV Vmean:          77.400 cm/s  PV Mean grad:  1.0 mmHg AV VTI:            0.200 m AV Peak Grad:      5.3 mmHg AV Mean Grad:      3.0 mmHg LVOT Vmax:         114.00 cm/s LVOT Vmean:        69.800 cm/s LVOT VTI:  0.202 m LVOT/AV VTI ratio: 1.01 AI PHT:            624 msec  AORTA Ao Root diam: 3.00 cm Ao Asc diam:  3.00 cm  SHUNTS Systemic VTI:  0.20 m Systemic Diam: 2.20 cm Rachelle Hora Croitoru MD Electronically signed by Thurmon Fair MD Signature Date/Time: 01/28/2022/5:40:59 PM    Final     Cardiac Studies      Patient Profile     76 y.o. male  with new onset atrial flutter   Assessment & Plan    Atrial flutter: Patient presented with new onset atrial flutter.  He is currently rate controlled.  He is scheduled for transesophageal echo/cardioversion tomorrow.  We discussed the risk, benefits, options of TEE cardioversion.  He understands and agrees to proceed.  I have written him for a diet today and we will hold him n.p.o. after midnight tonight.  2.  Hypothyroidism: His TSH is slightly elevated.  We will have Dr. Wylene Simmer address this as an outpatient.       For questions or updates, please contact [facility] Please consult [email or web address] for contact info under        Signed, Kristeen Miss, MD  01/29/2022, 11:03 AM

## 2022-01-29 NOTE — TOC Benefit Eligibility Note (Signed)
Patient Advocate Encounter ? ?Insurance verification completed.   ? ?The patient is currently admitted and upon discharge could be taking Eliquis 5 mg. ? ?The current 30 day co-pay is, $47.00.  ? ?The patient is insured through [employer] Part D  ? ? ? ?Lyndel Safe, CPhT ?Pharmacy Patient Advocate Specialist ?[facility] Patient Advocate Team ?Direct Number: [phone]  Fax: [phone] ? ? ? ? ? ?  ?

## 2022-01-29 NOTE — H&P (View-Only) (Signed)
? ?Progress Note ? ?Patient Name: Frederick Michael ?Date of Encounter: 01/29/2022 ? ?[facility] Cardiologist: Sanda Klein, MD   ? ?Subjective  ? ?76 year old gentleman with a history of diabetes, hypertension, hyperlipidemia, hypothyroidism is found to have atrial flutter. ? ?His rate is currently well controlled.  He has been on Eliquis.  He is scheduled for TEE cardioversion tomorrow. ? ?We discussed the procedure and have answered all of his questions.  We discussed the risk, benefits, options.  He understands and agrees to proceed. ? ?He should be able to be discharged tomorrow .   ?We will consid referring him to electrophysiology for consideration of A flutter ablation. ? ?Inpatient Medications  ?  ?Scheduled Meds: ? apixaban  5 mg Oral BID  ? diltiazem  60 mg Oral Q6H  ? levothyroxine  150 mcg Oral Q0600  ? pneumococcal 20-valent conjugate vaccine  0.5 mL Intramuscular Tomorrow-1000  ? ?Continuous Infusions: ? ?PRN Meds: ?acetaminophen, ondansetron (ZOFRAN) IV  ? ?Vital Signs  ?  ?Vitals:  ? 01/29/22 0418 01/29/22 0506 01/29/22 0755 01/29/22 1048  ?BP: 101/65 108/62 97/67 (!) 132/94  ?Pulse: 63  69 81  ?Resp: '18  20 19  '$ ?Temp: (!) 97.5 ?F (36.4 ?C)  97.6 ?F (36.4 ?C)   ?TempSrc: Oral     ?SpO2: 94%   94%  ?Weight:      ?Height:      ? ? ?Intake/Output Summary (Last 24 hours) at 01/29/2022 1103 ?Last data filed at 01/29/2022 1048 ?Gross per 24 hour  ?Intake --  ?Output 1350 ml  ?Net -1350 ml  ? ?Last 3 Weights 01/27/2022  ?Weight (lbs) 207 lb  ?Weight (kg) 93.895 kg  ?   ? ?Telemetry  ?  ?Atrial flutter with controlled V rate.  - Personally Reviewed ? ?ECG  ?  ? - Personally Reviewed ? ?Physical Exam  ? ?GEN: No acute distress.   ?Neck: No JVD ?Cardiac: RRR, no murmurs, rubs, or gallops.  ?Respiratory: Clear to auscultation bilaterally. ?GI: Soft, nontender, non-distended  ?MS: No edema; No deformity. ?Neuro:  Nonfocal  ?Psych: Normal affect  ? ?Labs  ?  ?High Sensitivity Troponin:  No results for  input(s): TROPONINIHS in the last 720 hours.   ?Chemistry ?Recent Labs  ?Lab 01/27/22 ?2001 01/27/22 ?2101 01/29/22 ?0031  ?NA 136  --  136  ?K 4.0  --  3.9  ?CL 100  --  108  ?CO2 25  --  22  ?GLUCOSE 169*  --  125*  ?BUN 29*  --  19  ?CREATININE 1.29*  --  1.14  ?CALCIUM 10.5*  --  8.3*  ?MG  --   --  1.6*  ?PROT  --  7.0  --   ?ALBUMIN  --  4.0  --   ?AST  --  23  --   ?ALT  --  30  --   ?ALKPHOS  --  36*  --   ?BILITOT  --  0.5  --   ?GFRNONAA 58*  --  >60  ?ANIONGAP 11  --  6  ?  ?Lipids  ?Recent Labs  ?Lab 01/29/22 ?0031  ?CHOL 125  ?TRIG 110  ?HDL 25*  ?Saline 78  ?CHOLHDL 5.0  ?  ?Hematology ?Recent Labs  ?Lab 01/27/22 ?2001 01/27/22 ?2101 01/29/22 ?0031  ?WBC 9.0 8.0 7.2  ?RBC 5.86* 5.61 4.94  ?HGB 17.3* 16.3 15.2  ?HCT 52.2* 49.3 44.5  ?MCV 89.1 87.9 90.1  ?MCH 29.5 29.1 30.8  ?MCHC  33.1 33.1 34.2  ?RDW 13.8 13.8 13.7  ?PLT 258 234 185  ? ?Thyroid  ?Recent Labs  ?Lab 01/28/22 ?1119 01/29/22 ?0031  ?TSH 8.764*  --   ?FREET4  --  0.91  ?  ?BNPNo results for input(s): BNP, PROBNP in the last 168 hours.  ?DDimer No results for input(s): DDIMER in the last 168 hours.  ? ?Radiology  ?  ?DG Chest Port 1 View ? ?Result Date: 01/27/2022 ?CLINICAL DATA:  Tachycardia EXAM: PORTABLE CHEST 1 VIEW COMPARISON:  None. FINDINGS: Cardiomegaly. Both lungs are clear. The visualized skeletal structures are unremarkable. IMPRESSION: Cardiomegaly without acute abnormality of the lungs in AP portable projection. Electronically Signed   By: Delanna Ahmadi M.D.   On: 01/27/2022 20:36  ? ?ECHOCARDIOGRAM COMPLETE ? ?Result Date: 01/28/2022 ?   ECHOCARDIOGRAM REPORT   Patient Name:   Frederick Michael [facility] Date of Exam: 01/28/2022 Medical Rec #:  [id number]          Height:       75.0 in Accession #:    [id number]         Weight:       207.0 lb Date of Birth:  07/07/1946          BSA:          2.227 m? Patient Age:    61 years           BP: Patient Gender: M                  HR:           88 bpm. Exam Location:  Inpatient Procedure: 2D Echo,  Cardiac Doppler and Color Doppler Indications:    Atrial flutter  History:        Patient has no prior history of Echocardiogram examinations.                 Arrythmias:Atrial Flutter.  Sonographer:    Beryle Beams Referring Phys: [place]  1. Left ventricular ejection fraction, by estimation, is 60 to 65%. The left ventricle has normal function. The left ventricle has no regional wall motion abnormalities. There is mild left ventricular hypertrophy of the basal-septal segment. Left ventricular diastolic function could not be evaluated.  2. Right ventricular systolic function is normal. The right ventricular size is mildly enlarged.  3. Left atrial size was mildly dilated.  4. The mitral valve is normal in structure. No evidence of mitral valve regurgitation. No evidence of mitral stenosis.  5. The aortic valve is normal in structure. Aortic valve regurgitation is not visualized. No aortic stenosis is present.  6. The inferior vena cava is normal in size with greater than 50% respiratory variability, suggesting right atrial pressure of 3 mmHg. FINDINGS  Left Ventricle: Left ventricular ejection fraction, by estimation, is 60 to 65%. The left ventricle has normal function. The left ventricle has no regional wall motion abnormalities. The left ventricular internal cavity size was normal in size. There is  mild left ventricular hypertrophy of the basal-septal segment. Left ventricular diastolic function could not be evaluated due to atrial fibrillation. Left ventricular diastolic function could not be evaluated. Right Ventricle: The right ventricular size is mildly enlarged. No increase in right ventricular wall thickness. Right ventricular systolic function is normal. Left Atrium: Left atrial size was mildly dilated. Right Atrium: Right atrial size was normal in size. Pericardium: There is no evidence of pericardial effusion. Mitral Valve: The mitral valve is normal  in structure. No evidence  of mitral valve regurgitation. No evidence of mitral valve stenosis. Tricuspid Valve: The tricuspid valve is normal in structure. Tricuspid valve regurgitation is not demonstrated. No evidence of tricuspid stenosis. Aortic Valve: The aortic valve is normal in structure. Aortic valve regurgitation is not visualized. Aortic regurgitation PHT measures 624 msec. No aortic stenosis is present. Aortic valve mean gradient measures 3.0 mmHg. Aortic valve peak gradient measures 5.3 mmHg. Aortic valve area, by VTI measures 3.84 cm?. Pulmonic Valve: The pulmonic valve was normal in structure. Pulmonic valve regurgitation is not visualized. No evidence of pulmonic stenosis. Aorta: The aortic root is normal in size and structure. Venous: The inferior vena cava is normal in size with greater than 50% respiratory variability, suggesting right atrial pressure of 3 mmHg. IAS/Shunts: No atrial level shunt detected by color flow Doppler.  LEFT VENTRICLE PLAX 2D LVIDd:         4.60 cm LVIDs:         2.30 cm LV PW:         1.10 cm LV IVS:        1.30 cm LVOT diam:     2.20 cm LV SV:         77 LV SV Index:   34 LVOT Area:     3.80 cm?  LV Volumes (MOD) LV vol d, MOD A2C: 63.7 ml LV vol d, MOD A4C: 81.0 ml LV vol s, MOD A2C: 21.5 ml LV vol s, MOD A4C: 43.7 ml LV SV MOD A2C:     42.2 ml LV SV MOD A4C:     81.0 ml LV SV MOD BP:      42.0 ml RIGHT VENTRICLE RV S prime:     11.90 cm/s TAPSE (M-mode): 1.5 cm LEFT ATRIUM             Index        RIGHT ATRIUM           Index LA diam:        3.90 cm 1.75 cm/m?   RA Area:     12.00 cm? LA Vol (A2C):   36.6 ml 16.44 ml/m?  RA Volume:   23.70 ml  10.64 ml/m? LA Vol (A4C):   42.6 ml 19.13 ml/m? LA Biplane Vol: 40.8 ml 18.32 ml/m?  AORTIC VALVE                    PULMONIC VALVE AV Area (Vmax):    3.77 cm?     PV Vmax:       0.70 m/s AV Area (Vmean):   3.43 cm?     PV Vmean:      42.600 cm/s AV Area (VTI):     3.84 cm?     PV VTI:        0.119 m AV Vmax:           115.00 cm/s  PV Peak grad:  1.9 mmHg  AV Vmean:          77.400 cm/s  PV Mean grad:  1.0 mmHg AV VTI:            0.200 m AV Peak Grad:      5.3 mmHg AV Mean Grad:      3.0 mmHg LVOT Vmax:         114.00 cm/s LVOT Vmean:        69.800 cm/s LVOT V

## 2022-01-29 NOTE — Progress Notes (Signed)
?PROGRESS NOTE ? ? ? ?Frederick Michael  NTI:[id number] DOB: 01-01-1946 DOA: 01/27/2022 ?PCP: Haywood Pao, MD  ? ? ?Brief Narrative:  ?76 year old with history of type 2 diabetes, hypertension, hyperlipidemia and hypothyroidism presented with sudden onset of chest fluttering for about 24 hours.  In the emergency room he was hemodynamically stable.  He was found to have 2-1 A-flutter with RVR.  Started on heparin and Cardizem infusion admit to the hospital.  Scheduled for TEE cardioversion today. ? ? ?Assessment & Plan: ?  ?New onset a flutter with RVR: Persistent a flutter.  Rate controlled. ?Currently tolerating Cardizem 60 mg every 6 hours.  Was on heparin, converted to Eliquis.  Scheduled for TEE and cardioversion today.  We will continue to monitor. ?TTE with normal ejection fraction. ? ?Type 2 diabetes: Well-controlled.  Diet controlled and not on treatment.  Monitor. ? ?Hypertension: Blood pressures low normal and now on Cardizem.  Holding lisinopril and hydrochlorothiazide. ? ?Hypothyroidism: TSH is 8.  Not contributing to tachycardia.  Resume thyroxine. ? ?Hypomagnesemia: Replace today. ? ? ?DVT prophylaxis:  ?apixaban (ELIQUIS) tablet 5 mg  ? ?Code Status: Full code ?Family Communication: None ?Disposition Plan: Status is: Inpatient ?Remains inpatient appropriate because: Inpatient cardiac procedure planned ?  ? ? ?Consultants:  ?Cardiology ? ?Procedures:  ?None ? ?Antimicrobials:  ?None ? ? ?Subjective: ?Patient seen and examined.  No overnight events.  Sometimes he does feel like he is aware about the heartbeat but not complaining of any chest pain, palpitations or dizziness.  Had questions about cardioversion and answered. ? ?Objective: ?Vitals:  ? 01/29/22 0002 01/29/22 8676 01/29/22 0506 01/29/22 0755  ?BP: (!) 137/103 101/65 108/62 97/67  ?Pulse: 85 63  69  ?Resp: (!) '22 18  20  '$ ?Temp: 97.8 ?F (36.6 ?C) (!) 97.5 ?F (36.4 ?C)  97.6 ?F (36.4 ?C)  ?TempSrc: Oral Oral    ?SpO2:  94%    ?Weight:       ?Height:      ? ? ?Intake/Output Summary (Last 24 hours) at 01/29/2022 0957 ?Last data filed at 01/29/2022 0755 ?Gross per 24 hour  ?Intake --  ?Output 1300 ml  ?Net -1300 ml  ? ?Filed Weights  ? 01/27/22 1953  ?Weight: 93.9 kg  ? ? ?Examination: ? ?General exam: Appears calm and comfortable  ?Respiratory system: Clear to auscultation. Respiratory effort normal. ?Cardiovascular system: S1 & S2 heard, irregularly irregular. No pedal edema. ?Gastrointestinal system: Abdomen is nondistended, soft and nontender. No organomegaly or masses felt. Normal bowel sounds heard. ?Central nervous system: Alert and oriented. No focal neurological deficits. ?Extremities: Symmetric 5 x 5 power. ?Skin: No rashes, lesions or ulcers ?Psychiatry: Judgement and insight appear normal. Mood & affect appropriate.  ? ? ? ?Data Reviewed: I have personally reviewed following labs and imaging studies ? ?CBC: ?Recent Labs  ?Lab 01/27/22 ?2001 01/27/22 ?2101 01/29/22 ?0031  ?WBC 9.0 8.0 7.2  ?NEUTROABS  --  5.3 4.1  ?HGB 17.3* 16.3 15.2  ?HCT 52.2* 49.3 44.5  ?MCV 89.1 87.9 90.1  ?PLT 258 234 185  ? ?Basic Metabolic Panel: ?Recent Labs  ?Lab 01/27/22 ?2001 01/29/22 ?0031  ?NA 136 136  ?K 4.0 3.9  ?CL 100 108  ?CO2 25 22  ?GLUCOSE 169* 125*  ?BUN 29* 19  ?CREATININE 1.29* 1.14  ?CALCIUM 10.5* 8.3*  ?MG  --  1.6*  ? ?GFR: ?Estimated Creatinine Clearance: 66.9 mL/min (by C-G formula based on SCr of 1.14 mg/dL). ?Liver Function Tests: ?Recent Labs  ?Lab  01/27/22 ?2101  ?AST 23  ?ALT 30  ?ALKPHOS 36*  ?BILITOT 0.5  ?PROT 7.0  ?ALBUMIN 4.0  ? ?No results for input(s): LIPASE, AMYLASE in the last 168 hours. ?No results for input(s): AMMONIA in the last 168 hours. ?Coagulation Profile: ?No results for input(s): INR, PROTIME in the last 168 hours. ?Cardiac Enzymes: ?No results for input(s): CKTOTAL, CKMB, CKMBINDEX, TROPONINI in the last 168 hours. ?BNP (last 3 results) ?No results for input(s): PROBNP in the last 8760 hours. ?HbA1C: ?Recent Labs  ?   01/29/22 ?0031  ?HGBA1C 7.3*  ? ?CBG: ?Recent Labs  ?Lab 01/29/22 ?0901  ?GLUCAP 144*  ? ?Lipid Profile: ?Recent Labs  ?  01/29/22 ?0031  ?CHOL 125  ?HDL 25*  ?[place] 78  ?TRIG 110  ?CHOLHDL 5.0  ? ?Thyroid Function Tests: ?Recent Labs  ?  01/28/22 ?1119 01/29/22 ?0031  ?TSH 8.764*  --   ?FREET4  --  0.91  ? ?Anemia Panel: ?No results for input(s): VITAMINB12, FOLATE, FERRITIN, TIBC, IRON, RETICCTPCT in the last 72 hours. ?Sepsis Labs: ?No results for input(s): PROCALCITON, LATICACIDVEN in the last 168 hours. ? ?Recent Results (from the past 240 hour(s))  ?Resp Panel by RT-PCR (Flu A&B, Covid) Nasopharyngeal Swab     Status: None  ? Collection Time: 01/27/22  9:01 PM  ? Specimen: Nasopharyngeal Swab; Nasopharyngeal(NP) swabs in vial transport medium  ?Result Value Ref Range Status  ? SARS Coronavirus 2 by RT PCR NEGATIVE NEGATIVE Final  ?  Comment: (NOTE) ?SARS-CoV-2 target nucleic acids are NOT DETECTED. ? ?The SARS-CoV-2 RNA is generally detectable in upper respiratory ?specimens during the acute phase of infection. The lowest ?concentration of SARS-CoV-2 viral copies this assay can detect is ?138 copies/mL. A negative result does not preclude SARS-Cov-2 ?infection and should not be used as the sole basis for treatment or ?other patient management decisions. A negative result may occur with  ?improper specimen collection/handling, submission of specimen other ?than nasopharyngeal swab, presence of viral mutation(s) within the ?areas targeted by this assay, and inadequate number of viral ?copies(<138 copies/mL). A negative result must be combined with ?clinical observations, patient history, and epidemiological ?information. The expected result is Negative. ? ?Fact Sheet for Patients:  ?EntrepreneurPulse.com.au ? ?Fact Sheet for Healthcare Providers:  ?IncredibleEmployment.be ? ?This test is no t yet approved or cleared by the [place] FDA and  ?has been authorized for  detection and/or diagnosis of SARS-CoV-2 by ?FDA under an Emergency Use Authorization (EUA). This EUA will remain  ?in effect (meaning this test can be used) for the duration of the ?COVID-19 declaration under Section 564(b)(1) of the Act, 21 ?U.S.C.section 360bbb-3(b)(1), unless the authorization is terminated  ?or revoked sooner.  ? ? ?  ? Influenza A by PCR NEGATIVE NEGATIVE Final  ? Influenza B by PCR NEGATIVE NEGATIVE Final  ?  Comment: (NOTE) ?The Xpert Xpress SARS-CoV-2/FLU/RSV plus assay is intended as an aid ?in the diagnosis of influenza from Nasopharyngeal swab specimens and ?should not be used as a sole basis for treatment. Nasal washings and ?aspirates are unacceptable for Xpert Xpress SARS-CoV-2/FLU/RSV ?testing. ? ?Fact Sheet for Patients: ?EntrepreneurPulse.com.au ? ?Fact Sheet for Healthcare Providers: ?IncredibleEmployment.be ? ?This test is not yet approved or cleared by the [place] FDA and ?has been authorized for detection and/or diagnosis of SARS-CoV-2 by ?FDA under an Emergency Use Authorization (EUA). This EUA will remain ?in effect (meaning this test can be used) for the duration of the ?COVID-19 declaration under Section 564(b)(1) of the  Act, 21 U.S.C. ?section 360bbb-3(b)(1), unless the authorization is terminated or ?revoked. ? ?Performed at [facility], [address], ?[place], [place] [address] ?  ?MRSA Next Gen by PCR, Nasal     Status: None  ? Collection Time: 01/28/22  1:39 AM  ? Specimen: Nasal Mucosa; Nasal Swab  ?Result Value Ref Range Status  ? MRSA by PCR Next Gen NOT DETECTED NOT DETECTED Final  ?  Comment: (NOTE) ?The GeneXpert MRSA Assay (FDA approved for NASAL specimens only), ?is one component of a comprehensive MRSA colonization surveillance ?program. It is not intended to diagnose MRSA infection nor to guide ?or monitor treatment for MRSA infections. ?Test performance is not FDA approved in patients less than  2 years ?old. ?Performed at [facility], [place] [address] [place], [place] ?[address] ?  ?  ? ? ? ? ? ?Radiology Studies: ?DG Chest Port 1 View ? ?Result Date: 01/27/2022 ?CLINICAL DATA:  Tachycardia EXAM:

## 2022-01-29 NOTE — Progress Notes (Signed)
Pt HR dropping periodically into high 40s. Pt is asymptomatic. Provider Ghimire MD notified via secure chat.  ?

## 2022-01-29 NOTE — Plan of Care (Signed)
?  Problem: Education: ?Goal: Knowledge of General Education information will improve ?Description: Including pain rating scale, medication(s)/side effects and non-pharmacologic comfort measures ?Outcome: Progressing ?  ?Problem: Health Behavior/Discharge Planning: ?Goal: Ability to manage health-related needs will improve ?Outcome: Progressing ?  ?Problem: Nutrition: ?Goal: Adequate nutrition will be maintained ?Outcome: Progressing ?  ?Problem: Elimination: ?Goal: Will not experience complications related to bowel motility ?Outcome: Progressing ?Goal: Will not experience complications related to urinary retention ?Outcome: Progressing ?  ?Problem: Safety: ?Goal: Ability to remain free from injury will improve ?Outcome: Progressing ?  ?Problem: Skin Integrity: ?Goal: Risk for impaired skin integrity will decrease ?Outcome: Progressing ?  ?Problem: Clinical Measurements: ?Goal: Ability to maintain clinical measurements within normal limits will improve ?Outcome: Not Progressing ?  ?

## 2022-01-30 ENCOUNTER — Inpatient Hospital Stay (HOSPITAL_COMMUNITY): Payer: Medicare Other | Admitting: Certified Registered Nurse Anesthetist

## 2022-01-30 ENCOUNTER — Encounter (HOSPITAL_COMMUNITY)
Admission: EM | Disposition: A | Discharge status: Home / Self Care | Payer: Self-pay | Source: Home / Self Care | Attending: Internal Medicine

## 2022-01-30 ENCOUNTER — Encounter (HOSPITAL_COMMUNITY): Payer: Self-pay | Admitting: Internal Medicine

## 2022-01-30 ENCOUNTER — Inpatient Hospital Stay (HOSPITAL_COMMUNITY): Payer: Medicare Other

## 2022-01-30 ENCOUNTER — Other Ambulatory Visit (HOSPITAL_COMMUNITY): Payer: Self-pay

## 2022-01-30 DIAGNOSIS — I4892 Unspecified atrial flutter: Secondary | ICD-10-CM

## 2022-01-30 DIAGNOSIS — I10 Essential (primary) hypertension: Secondary | ICD-10-CM

## 2022-01-30 DIAGNOSIS — I4891 Unspecified atrial fibrillation: Secondary | ICD-10-CM

## 2022-01-30 DIAGNOSIS — E119 Type 2 diabetes mellitus without complications: Secondary | ICD-10-CM

## 2022-01-30 DIAGNOSIS — I483 Typical atrial flutter: Secondary | ICD-10-CM

## 2022-01-30 DIAGNOSIS — E039 Hypothyroidism, unspecified: Secondary | ICD-10-CM

## 2022-01-30 HISTORY — PX: TEE WITHOUT CARDIOVERSION: SHX5443

## 2022-01-30 HISTORY — PX: CARDIOVERSION: SHX1299

## 2022-01-30 LAB — CBC
HCT: 44.9 % (ref 39.0–52.0)
Hemoglobin: 15.1 g/dL (ref 13.0–17.0)
MCH: 29.9 pg (ref 26.0–34.0)
MCHC: 33.6 g/dL (ref 30.0–36.0)
MCV: 88.9 fL (ref 80.0–100.0)
Platelets: 172 10*3/uL (ref 150–400)
RBC: 5.05 MIL/uL (ref 4.22–5.81)
RDW: 13.6 % (ref 11.5–15.5)
WBC: 6.6 10*3/uL (ref 4.0–10.5)
nRBC: 0 % (ref 0.0–0.2)

## 2022-01-30 LAB — GLUCOSE, CAPILLARY
Glucose-Capillary: 136 mg/dL — ABNORMAL HIGH (ref 70–99)
Glucose-Capillary: 142 mg/dL — ABNORMAL HIGH (ref 70–99)
Glucose-Capillary: 218 mg/dL — ABNORMAL HIGH (ref 70–99)

## 2022-01-30 SURGERY — ECHOCARDIOGRAM, TRANSESOPHAGEAL
Anesthesia: Monitor Anesthesia Care

## 2022-01-30 MED ORDER — PROPOFOL 500 MG/50ML IV EMUL
INTRAVENOUS | Status: DC | PRN
  Administered 2022-01-30: 125 ug/kg/min via INTRAVENOUS

## 2022-01-30 MED ORDER — DILTIAZEM HCL ER COATED BEADS 120 MG PO CP24
120.0000 mg | ORAL_CAPSULE | Freq: Every day | ORAL | 0 refills | Status: DC
  Filled 2022-01-30: qty 30, 30d supply, fill #0

## 2022-01-30 MED ORDER — DILTIAZEM HCL ER COATED BEADS 120 MG PO CP24
120.0000 mg | ORAL_CAPSULE | Freq: Every day | ORAL | Status: DC
Start: 2022-01-31 — End: 2022-01-30

## 2022-01-30 MED ORDER — PROMETHAZINE HCL 25 MG/ML IJ SOLN: 6.2500 mg | INTRAMUSCULAR | Status: DC | PRN

## 2022-01-30 MED ORDER — OXYCODONE HCL 5 MG PO TABS: 5.0000 mg | ORAL_TABLET | Freq: Once | ORAL | Status: DC | PRN

## 2022-01-30 MED ORDER — METFORMIN HCL 500 MG PO TABS
500.0000 mg | ORAL_TABLET | Freq: Two times a day (BID) | ORAL | 2 refills | Status: DC
  Filled 2022-01-30: qty 60, 30d supply, fill #0

## 2022-01-30 MED ORDER — PROPOFOL 10 MG/ML IV BOLUS
INTRAVENOUS | Status: DC | PRN
  Administered 2022-01-30: 20 mg via INTRAVENOUS
  Administered 2022-01-30: 10 mg via INTRAVENOUS

## 2022-01-30 MED ORDER — HYDROMORPHONE HCL 1 MG/ML IJ SOLN: 0.2500 mg | INTRAMUSCULAR | Status: DC | PRN

## 2022-01-30 MED ORDER — OXYCODONE HCL 5 MG/5ML PO SOLN: 5.0000 mg | Freq: Once | ORAL | Status: DC | PRN

## 2022-01-30 MED ORDER — APIXABAN 5 MG PO TABS
5.0000 mg | ORAL_TABLET | Freq: Two times a day (BID) | ORAL | 2 refills | Status: DC
  Filled 2022-01-30: qty 60, 30d supply, fill #0

## 2022-01-30 MED ORDER — EPHEDRINE SULFATE-NACL 50-0.9 MG/10ML-% IV SOSY
PREFILLED_SYRINGE | INTRAVENOUS | Status: DC | PRN
  Administered 2022-01-30: 5 mg via INTRAVENOUS

## 2022-01-30 NOTE — Op Note (Signed)
Procedure: Electrical Cardioversion ?Indications:  Atrial Flutter ? ?Procedure Details: ? ?Consent: Risks of procedure as well as the alternatives and risks of each were explained to the (patient/caregiver).  Consent for procedure obtained. ? ?Time Out: Verified patient identification, verified procedure, site/side was marked, verified correct patient position, special equipment/implants available, medications/allergies/relevent history reviewed, required imaging and test results available.  Performed ? ?Patient placed on cardiac monitor, pulse oximetry, supplemental oxygen as necessary.  ?Sedation given:  propofol IV, Dr. Sabra Heck ?Pacer pads placed anterior and posterior chest. ? ?Cardioverted 1 time(s).  ?Cardioversion with synchronized biphasic 120J shock. ? ?Evaluation: ?Findings: Post procedure EKG shows: NSR ?Complications: None ?Patient did tolerate procedure well. ? ?Time Spent Directly with the Patient: ? ?30 minutes  ? ?Geraline Halberstadt ?01/30/2022, 8:46 AM ? ? ? ? ?

## 2022-01-30 NOTE — Progress Notes (Signed)
?  Echocardiogram ?Echocardiogram Transesophageal has been performed. ? Frederick Michael ?01/30/2022, 9:15 AM ?

## 2022-01-30 NOTE — Progress Notes (Signed)
Went over discharge paperwork with patient and wife. All questions answered, PIV removed. Pt stable for discharge. All belongings returned.  ?

## 2022-01-30 NOTE — Interval H&P Note (Signed)
History and Physical Interval Note: ? ?01/30/2022 ?8:09 AM ? ?Frederick Michael  has presented today for surgery, with the diagnosis of AFLUTTER.  The various methods of treatment have been discussed with the patient and family. After consideration of risks, benefits and other options for treatment, the patient has consented to  Procedure(s): ?TRANSESOPHAGEAL ECHOCARDIOGRAM (TEE) (N/A) ?CARDIOVERSION (N/A) as a surgical intervention.  The patient's history has been reviewed, patient examined, no change in status, stable for surgery.  I have reviewed the patient's chart and labs.  Questions were answered to the patient's satisfaction.   ? ? ?[name] ? ? ?

## 2022-01-30 NOTE — Discharge Summary (Signed)
Physician Discharge Summary  ?Mack Alvidrez DXI:[id number] DOB: 1946/03/20 DOA: 01/27/2022 ? ?PCP: Haywood Pao, MD ? ?Admit date: 01/27/2022 ?Discharge date: 01/30/2022 ? ?Admitted From: Home ?Disposition: Home ? ?Recommendations for Outpatient Follow-up:  ?Follow up with PCP in 1-2 weeks ?Cardiology will schedule follow-up, they will call. ? ?Home Health: N/A ?Equipment/Devices: N/A ? ?Discharge Condition: Stable ?CODE STATUS: Full code ?Diet recommendation: Low-salt, low-carb diet ? ?Discharge summary: ?76 year old with history of type 2 diabetes currently not on treatment, hypertension, hyperlipidemia and hypothyroidism presented with sudden onset of chest fluttering for about 24 hours.  In the emergency room he was hemodynamically stable.  He was found to have 2-1 A-flutter with RVR.  Started on heparin and Cardizem infusion admit to the hospital.  Patient remained with persistent a flutter, underwent TEE and cardioversion today 3/21 with successful conversion to sinus rhythm. ? ?Paroxysmal a flutter with RVR: Currently sinus rhythm.  Not requiring much rate control medications.  Cardiology recommended discharging on Cardizem to 120 mg daily, started on Eliquis. ?Patient does have a history of hypertension, however his blood pressures in the hospital has been mostly less than 110 and he is not receiving his lisinopril and hydrochlorothiazide.  Now on Cardizem, he may not need this blood pressure medications.  We will discontinue and will need reassessment as outpatient.  Suggested to keep a logbook of blood pressures at home.  Cardiology will schedule follow-up. ? ?Type 2 diabetes, hyperglycemia.  A1c 7.3.  He is attempting diet control, however his A1c is persistently more than 7 and blood sugars more than 120 fasting.  We discussed whether to start him on medications and patient is agreeable to start on metformin.  Will discharge with metformin 500 mg twice daily.  Will need recheck A1c in 3  months. ? ?Hypothyroidism: Recent TSH 10, TSH 8 in the hospital.  T4 is normal.  There was plan to monitor but not increase the dose of thyroxine from his primary care physician.  Will not change any doses. ? ?Patient medically stable.  Remains asymptomatic after coming back from TEE and cardioversion.  Able to go home today.  Electrolytes replaced before discharge. ? ? ?Discharge Diagnoses:  ?Principal Problem: ?  Typical atrial flutter ([place]) ?Active Problems: ?  HTN (hypertension) ?  DM2 (diabetes mellitus, type 2) ([place]) ?  HLD (hyperlipidemia) ?  Hypothyroidism ? ? ? ?Discharge Instructions ? ?Discharge Instructions   ? ? Diet - low sodium heart healthy   Complete by: As directed ?  ? Diet Carb Modified   Complete by: As directed ?  ? Increase activity slowly   Complete by: As directed ?  ? ?  ? ?Allergies as of 01/30/2022   ?No Known Allergies ?  ? ?  ?Medication List  ?  ? ?STOP taking these medications   ? ?hydrochlorothiazide 12.5 MG tablet ?Commonly known as: HYDRODIURIL ?  ?ibuprofen 200 MG tablet ?Commonly known as: ADVIL ?  ?lisinopril 40 MG tablet ?Commonly known as: ZESTRIL ?  ? ?  ? ?TAKE these medications   ? ?apixaban 5 MG Tabs tablet ?Commonly known as: ELIQUIS ?Take 1 tablet (5 mg total) by mouth 2 (two) times daily. ?  ?diltiazem 120 MG 24 hr capsule ?Commonly known as: CARDIZEM CD ?Take 1 capsule (120 mg total) by mouth daily. ?Start taking on: January 31, 2022 ?  ?levothyroxine 150 MCG tablet ?Commonly known as: SYNTHROID ?Take 150 mcg by mouth daily. ?  ?metFORMIN 500 MG tablet ?Commonly known as:  Glucophage ?Take 1 tablet (500 mg total) by mouth 2 (two) times daily with a meal. ?  ?pravastatin 80 MG tablet ?Commonly known as: PRAVACHOL ?Take 80 mg by mouth at bedtime. ?  ?SALONPAS EX ?Apply 1 patch topically daily as needed (pain). ?  ? ?  ? ? Follow-up Information   ? ? Deberah Pelton, NP Follow up.   ?Specialty: Cardiology ?Why: [place] - a cardiology follow-up  appointment has been arranged for you on Tuesday Feb 13, 2022 8:25 AM (Arrive by 8:10 AM). Denyse Amass is one of our nurse practitioners with the general cardiology team.  Please note location difference from 4/6 appointment. ?Contact information: ?[address] ?[address] ?[place] [address] ?[phone] ? ? ?  ?  ? ? Camnitz, Ocie Doyne, MD Follow up.   ?Specialty: Cardiology ?Why: [place] - We have arranged an electrophysiology consultation with Dr. Curt Bears on Thursday Feb 15, 2022 at 3:00 PM (Arrive by 2:45 PM). Please note location difference from 4/4 appointment. ?Contact information: ?[address] ?[address] ?[place] [address] ?[phone] ? ? ?  ?  ? ?  ?  ? ?  ? ?No Known Allergies ? ?Consultations: ?Cardiology ? ? ?Procedures/Studies: ?DG Chest Port 1 View ? ?Result Date: 01/27/2022 ?CLINICAL DATA:  Tachycardia EXAM: PORTABLE CHEST 1 VIEW COMPARISON:  None. FINDINGS: Cardiomegaly. Both lungs are clear. The visualized skeletal structures are unremarkable. IMPRESSION: Cardiomegaly without acute abnormality of the lungs in AP portable projection. Electronically Signed   By: Delanna Ahmadi M.D.   On: 01/27/2022 20:36  ? ?ECHOCARDIOGRAM COMPLETE ? ?Result Date: 01/28/2022 ?   ECHOCARDIOGRAM REPORT   Patient Name:   AADARSH COZORT [facility] Date of Exam: 01/28/2022 Medical Rec #:  [id number]          Height:       75.0 in Accession #:    [id number]         Weight:       207.0 lb Date of Birth:  1946-10-05          BSA:          2.227 m? Patient Age:    76 years           BP: Patient Gender: M                  HR:           88 bpm. Exam Location:  Inpatient Procedure: 2D Echo, Cardiac Doppler and Color Doppler Indications:    Atrial flutter  History:        Patient has no prior history of Echocardiogram examinations.                 Arrythmias:Atrial Flutter.  Sonographer:    Beryle Beams Referring Phys: [place]  1. Left ventricular ejection fraction, by  estimation, is 60 to 65%. The left ventricle has normal function. The left ventricle has no regional wall motion abnormalities. There is mild left ventricular hypertrophy of the basal-septal segment. Left ventricular diastolic function could not be evaluated.  2. Right ventricular systolic function is normal. The right ventricular size is mildly enlarged.  3. Left atrial size was mildly dilated.  4. The mitral valve is normal in structure. No evidence of mitral valve regurgitation. No evidence of mitral stenosis.  5. The aortic valve is normal in structure. Aortic valve regurgitation is not visualized. No aortic stenosis is  present.  6. The inferior vena cava is normal in size with greater than 50% respiratory variability, suggesting right atrial pressure of 3 mmHg. FINDINGS  Left Ventricle: Left ventricular ejection fraction, by estimation, is 60 to 65%. The left ventricle has normal function. The left ventricle has no regional wall motion abnormalities. The left ventricular internal cavity size was normal in size. There is  mild left ventricular hypertrophy of the basal-septal segment. Left ventricular diastolic function could not be evaluated due to atrial fibrillation. Left ventricular diastolic function could not be evaluated. Right Ventricle: The right ventricular size is mildly enlarged. No increase in right ventricular wall thickness. Right ventricular systolic function is normal. Left Atrium: Left atrial size was mildly dilated. Right Atrium: Right atrial size was normal in size. Pericardium: There is no evidence of pericardial effusion. Mitral Valve: The mitral valve is normal in structure. No evidence of mitral valve regurgitation. No evidence of mitral valve stenosis. Tricuspid Valve: The tricuspid valve is normal in structure. Tricuspid valve regurgitation is not demonstrated. No evidence of tricuspid stenosis. Aortic Valve: The aortic valve is normal in structure. Aortic valve regurgitation is not  visualized. Aortic regurgitation PHT measures 624 msec. No aortic stenosis is present. Aortic valve mean gradient measures 3.0 mmHg. Aortic valve peak gradient measures 5.3 mmHg. Aortic valve area, by VTI measures 3.84 cm?Fatima [place]

## 2022-01-30 NOTE — Transfer of Care (Signed)
Immediate Anesthesia Transfer of Care Note ? ?Patient: Frederick Michael ? ?Procedure(s) Performed: TRANSESOPHAGEAL ECHOCARDIOGRAM (TEE) ?CARDIOVERSION ? ?Patient Location: PACU ? ?Anesthesia Type:General ? ?Level of Consciousness: awake and alert  ? ?Airway & Oxygen Therapy: Patient Spontanous Breathing and Patient connected to nasal cannula oxygen ? ?Post-op Assessment: Report given to RN and Post -op Vital signs reviewed and stable ? ?Post vital signs: Reviewed and stable ? ?Last Vitals:  ?Vitals Value Taken Time  ?BP    ?Temp    ?Pulse    ?Resp    ?SpO2    ? ? ?Last Pain:  ?Vitals:  ? 01/30/22 0756  ?TempSrc: Oral  ?PainSc: 0-No pain  ?   ? ?  ? ?Complications: No notable events documented. ?

## 2022-01-30 NOTE — Anesthesia Procedure Notes (Signed)
Procedure Name: [place] ?Date/Time: 01/30/2022 8:30 AM ?Performed by: Inda Coke, CRNA ?Pre-anesthesia Checklist: Patient identified, Emergency Drugs available, Suction available, Timeout performed and Patient being monitored ?Patient Re-evaluated:Patient Re-evaluated prior to induction ?Oxygen Delivery Method: Nasal cannula ?Induction Type: IV induction ?Dental Injury: Teeth and Oropharynx as per pre-operative assessment  ? ? ? ? ?

## 2022-01-30 NOTE — Progress Notes (Addendum)
Dr. Acie Fredrickson relayed recs to primary team and follow-up has been arranged per his request and placed on AVS with reminder of location differences. ?

## 2022-01-30 NOTE — Anesthesia Preprocedure Evaluation (Signed)
Anesthesia Evaluation  ?Patient identified by MRN, date of birth, ID band ?Patient awake ? ? ? ?Reviewed: ?Allergy & Precautions, NPO status , Patient's Chart, lab work & pertinent test results ? ?Airway ?Mallampati: II ? ?TM Distance: >3 FB ?Neck ROM: Full ? ? ? Dental ?no notable dental hx. ? ?  ?Pulmonary ?neg pulmonary ROS, former smoker,  ?  ?Pulmonary exam normal ?breath sounds clear to auscultation ? ? ? ? ? ? Cardiovascular ?hypertension, Pt. on medications ?negative cardio ROS ?Normal cardiovascular exam ?Rhythm:Regular Rate:Normal ? ? ?  ?Neuro/Psych ?negative neurological ROS ? negative psych ROS  ? GI/Hepatic ?negative GI ROS, Neg liver ROS,   ?Endo/Other  ?diabetes, Type 2Hypothyroidism  ? Renal/GU ?negative Renal ROS  ?negative genitourinary ?  ?Musculoskeletal ?negative musculoskeletal ROS ?(+)  ? Abdominal ?  ?Peds ?negative pediatric ROS ?(+)  Hematology ?negative hematology ROS ?(+)   ?Anesthesia Other Findings ? ? Reproductive/Obstetrics ?negative OB ROS ? ?  ? ? ? ? ? ? ? ? ? ? ? ? ? ?  ?  ? ? ? ? ? ? ? ? ?Anesthesia Physical ?Anesthesia Plan ? ?ASA: 3 ? ?Anesthesia Plan: MAC  ? ?Post-op Pain Management: Minimal or no pain anticipated  ? ?Induction: Intravenous ? ?PONV Risk Score and Plan: 1 and Ondansetron and Treatment may vary due to age or medical condition ? ?Airway Management Planned: Nasal Cannula ? ?Additional Equipment:  ? ?Intra-op Plan:  ? ?Post-operative Plan:  ? ?Informed Consent: I have reviewed the patients History and Physical, chart, labs and discussed the procedure including the risks, benefits and alternatives for the proposed anesthesia with the patient or authorized representative who has indicated his/her understanding and acceptance.  ? ? ? ?Dental advisory given ? ?Plan Discussed with: CRNA ? ?Anesthesia Plan Comments:   ? ? ? ? ? ? ?Anesthesia Quick Evaluation ? ?

## 2022-01-30 NOTE — Anesthesia Postprocedure Evaluation (Signed)
Anesthesia Post Note ? ?Patient: Frederick Michael ? ?Procedure(s) Performed: TRANSESOPHAGEAL ECHOCARDIOGRAM (TEE) ?CARDIOVERSION ? ?  ? ?Patient location during evaluation: PACU ?Anesthesia Type: MAC ?Level of consciousness: awake and alert ?Pain management: pain level controlled ?Vital Signs Assessment: post-procedure vital signs reviewed and stable ?Respiratory status: spontaneous breathing, nonlabored ventilation and respiratory function stable ?Cardiovascular status: blood pressure returned to baseline and stable ?Postop Assessment: no apparent nausea or vomiting ?Anesthetic complications: no ? ? ?No notable events documented. ? ?Last Vitals:  ?Vitals:  ? 01/30/22 0910 01/30/22 0925  ?BP: 98/73 100/70  ?Pulse: 61 60  ?Resp: 19 18  ?Temp:  36.6 ?C  ?SpO2: 97% 97%  ?  ?Last Pain:  ?Vitals:  ? 01/30/22 0925  ?TempSrc:   ?PainSc: 0-No pain  ? ? ?  ?  ?  ?  ?  ?  ? ?Lynda Rainwater ? ? ? ? ?

## 2022-01-31 ENCOUNTER — Encounter (HOSPITAL_COMMUNITY): Payer: Self-pay | Admitting: Cardiovascular Disease

## 2022-01-31 ENCOUNTER — Encounter: Payer: Self-pay | Admitting: General Practice

## 2022-01-31 DIAGNOSIS — Z79899 Other long term (current) drug therapy: Secondary | ICD-10-CM

## 2022-01-31 NOTE — Telephone Encounter (Signed)
error 

## 2022-01-31 NOTE — Telephone Encounter (Signed)
When patient was discharged form hospital this past Saturday, His hctz and lisinopril were stopped and patient was started on Cardizem and eliquis. His BP since then has ranged from 130/70 to 151/80. Before the med changes, BP was 120/ 65-70. Patient is concerned about BP readings due to changes in medications. Please advise. ?

## 2022-02-01 ENCOUNTER — Telehealth: Payer: Self-pay

## 2022-02-01 ENCOUNTER — Other Ambulatory Visit: Payer: Self-pay

## 2022-02-01 DIAGNOSIS — Z79899 Other long term (current) drug therapy: Secondary | ICD-10-CM

## 2022-02-01 MED ORDER — HYDROCHLOROTHIAZIDE 12.5 MG PO CAPS: 12.5000 mg | ORAL_CAPSULE | Freq: Every day | ORAL | 3 refills | Status: DC

## 2022-02-01 NOTE — Telephone Encounter (Signed)
Informed patient to start taking hydrochlorothiazide 12.5 mg daily. He has enough, but needs to change his pharmacy to [facility], [place], [place], [place]. Phone [phone]. Also, pat will come for lab work (BMET) on 3/30 at [facility]. ?

## 2022-02-01 NOTE — Telephone Encounter (Signed)
Spoke with patient to inform him that I sent in HCTZ 12.5 mg to [facility]. Patient wanted that order cancelled because he has enough of the medication. I spoke with Rodena Piety at [facility] and she cancelled the HCTZ order from Frederick Michael, [place]. I informed patient of this, and that the orders for cardizem and eliquis can be refilled or adjusted at his 4/4 appointment with Frederick Memos, FNP. Pt. voiced understanding of this. conversation.Marland Kitchen ?

## 2022-02-01 NOTE — Telephone Encounter (Signed)
Please have Mr. Meulemans restart his HCTZ at 12.5 mg daily. Please order a BMP for one week from today. Thank you.  ? ?Jossie Ng. Akshath Mccarey NP-C ? ?  ?02/01/2022, 7:21 AM ?[place] ?[address] ?Office [phone] Fax [phone] ? ?

## 2022-02-01 NOTE — Telephone Encounter (Signed)
error 

## 2022-02-01 NOTE — Telephone Encounter (Signed)
This note is from 02/01/2002 at 9:09 am..Marland KitchenInformed patient to start taking hydrochlorothiazide 12.5 mg daily. He has enough, but needs to change his pharmacy to [facility], [place], [place], [place]. Phone [phone]. Also, pt will come for lab work (BMET) on 3/30 at [place]. ?

## 2022-02-02 ENCOUNTER — Telehealth: Payer: Self-pay

## 2022-02-02 ENCOUNTER — Other Ambulatory Visit: Payer: Self-pay

## 2022-02-02 NOTE — Telephone Encounter (Signed)
Medication added to pt list. ?

## 2022-02-02 NOTE — Telephone Encounter (Signed)
Note brought to triage via Charlena Cross who requests that pt be called back after voicemail left for her this morning asking for clarification on what pt should do regarding him blood pressure and medications that he was taken off of at his discharge from the hospital after being admitted for atrial fibrillation.  ? ?Pt states the he had a great experience while in the hospital and would like to complement our inpatient facility for the care that they provided him. Pt states that after discharge on 3/21, his blood pressure started to trend up, with pressures in the 130-150 range over 70-80. Pt states that he sent a message in to our office Coletta Memos, NP) stating what was going on.  ? ?Pt feels that he was promised phone calls back in multiple instances and those calls were never received by him. Pt felt that his needs were not being heard. Pt was also told that a mix up with his prescription had been taken care of and he followed up today to which he states that it was not actually taken care of. ? ?Pt mentions that he called to provide feedback and he was then again not called back by the person that he left a message for.  ? ?Pt states that he is appreciative of the phone call and time to listen to him. Pt states that medication questions were answered by him PCP. Pt states he will follow up with labs and scheduled appointments. Expressed to pt that we are sorry for his experience and that in the future we hope to make a better impression on him.  ?

## 2022-02-02 NOTE — Progress Notes (Signed)
Erroneous encounter

## 2022-02-06 DIAGNOSIS — E7800 Pure hypercholesterolemia, unspecified: Secondary | ICD-10-CM | POA: Diagnosis not present

## 2022-02-06 DIAGNOSIS — I480 Paroxysmal atrial fibrillation: Secondary | ICD-10-CM | POA: Diagnosis not present

## 2022-02-06 DIAGNOSIS — I10 Essential (primary) hypertension: Secondary | ICD-10-CM | POA: Diagnosis not present

## 2022-02-06 DIAGNOSIS — E039 Hypothyroidism, unspecified: Secondary | ICD-10-CM | POA: Diagnosis not present

## 2022-02-06 DIAGNOSIS — E119 Type 2 diabetes mellitus without complications: Secondary | ICD-10-CM | POA: Diagnosis not present

## 2022-02-07 ENCOUNTER — Other Ambulatory Visit (HOSPITAL_COMMUNITY): Payer: Self-pay

## 2022-02-07 ENCOUNTER — Telehealth (HOSPITAL_COMMUNITY): Payer: Self-pay

## 2022-02-07 NOTE — Telephone Encounter (Signed)
Pharmacy Transitions of Care Follow-up Telephone Call ? ?Date of discharge: 01/30/22  ?Discharge Diagnosis: A flutter ? ?How have you been since you were released from the hospital? Patient has had some high blood pressure since dischrage and tried to get in touch with [facility] for assistance but could not get a response from them. Called PCP and Dr. Osborne Casco added on half his previous lisinopril dose. Patient is monitoring blood sugar and discussed plan with Dr. Osborne Casco involving lifestyle choices so he is not currently taking his metformin. Patient was given counsel that metformin was first line for diabetes, but as he has a plan with his doctor and no diabetes diagnosis, it is fine to stay off Metformin as long as he is watching his blood sugar and A1c. ? ?Medication changes made at discharge: ?    START taking: ?diltiazem (CARDIZEM CD)  ?Eliquis (apixaban)  ?metFORMIN (Glucophage)  ?STOP taking: ?hydrochlorothiazide 12.5 MG tablet (HYDRODIURIL)  ?ibuprofen 200 MG tablet (ADVIL)  ?lisinopril 40 MG tablet (ZESTRIL)  ? ?Medication changes verified by the patient? Yes ?  ? ?Medication Accessibility: ? ?Home Pharmacy: [facility]  ? ?Was the patient provided with refills on discharged medications? Yes  ? ?Have all prescriptions been transferred from [facility] to home pharmacy? No, cannot transfer meds and patient wants to wait  ? ?Is the patient able to afford medications? Has insurance ?  ? ?Medication Review: ? ?Patient verified he received counseling on Eliquis before discharge. ?APIXABAN (ELIQUIS)  ?Apixaban 5 mg BID initiated on 01/30/22.  ?- Discussed importance of taking medication around the same time everyday  ?- Advised patient of medications to avoid (NSAIDs, ASA)  ?- Educated that Tylenol (acetaminophen) will be the preferred analgesic to prevent risk of bleeding  ?- Emphasized importance of monitoring for signs and symptoms of bleeding (abnormal bruising, prolonged bleeding, nose bleeds, bleeding from gums,  discolored urine, black tarry stools)  ?- Advised patient to alert all providers of anticoagulation therapy prior to starting a new medication or having a procedure  ? ?Follow-up Appointments: ? ?[facility] f/u appt confirmed? Scheduled to see Dr. Marilynn Rail on 02/13/22 @ 8:25am.  ? ?If their condition worsens, is the pt aware to call PCP or go to the Emergency Dept.? Yes ? ?Final Patient Assessment: ?Patient has f/u scheduled and knows to get refills at follow up. ? ?

## 2022-02-08 DIAGNOSIS — Z79899 Other long term (current) drug therapy: Secondary | ICD-10-CM | POA: Diagnosis not present

## 2022-02-08 LAB — BASIC METABOLIC PANEL
BUN/Creatinine Ratio: 22 (ref 10–24)
BUN: 25 mg/dL (ref 8–27)
CO2: 22 mmol/L (ref 20–29)
Calcium: 9.7 mg/dL (ref 8.6–10.2)
Chloride: 99 mmol/L (ref 96–106)
Creatinine, Ser: 1.14 mg/dL (ref 0.76–1.27)
Glucose: 146 mg/dL — ABNORMAL HIGH (ref 70–99)
Potassium: 4.7 mmol/L (ref 3.5–5.2)
Sodium: 135 mmol/L (ref 134–144)
eGFR: 67 mL/min/{1.73_m2} (ref >59)

## 2022-02-09 NOTE — Progress Notes (Signed)
? ?Cardiology Clinic Note  ? ?Patient Name: Frederick Michael ?Date of Encounter: 02/13/2022 ? ?Primary Care Provider:  Haywood Pao, MD ?Primary Cardiologist:  Sanda Klein, MD ? ?Patient Profile  ?  ?Frederick Michael 76 year old male presents to the clinic today for follow-up evaluation of his atrial flutter post DCCV 01/30/2022 and hypertension. ? ?Past Medical History  ?  ?Past Medical History:  ?Diagnosis Date  ? DM2 (diabetes mellitus, type 2) ([place])   ? High cholesterol   ? Hypertension   ? Thyroid disease   ? ?Past Surgical History:  ?Procedure Laterality Date  ? CARDIOVERSION N/A 01/30/2022  ? Procedure: CARDIOVERSION;  Surgeon: Sanda Klein, MD;  Location: [facility];  Service: Cardiovascular;  Laterality: N/A;  ? ELBOW SURGERY    ? TEE WITHOUT CARDIOVERSION N/A 01/30/2022  ? Procedure: TRANSESOPHAGEAL ECHOCARDIOGRAM (TEE);  Surgeon: Sanda Klein, MD;  Location: [facility];  Service: Cardiovascular;  Laterality: N/A;  ? ? ?Allergies ? ?No Known Allergies ? ?History of Present Illness  ?  ?Frederick Michael has a PMH of atrial flutter status post DCCV 01/30/2022, essential hypertension, HLD, diabetes, and hypothyroidism. ? ?He was found to be in rate controlled atrial flutter.  He reported compliance with his Eliquis.  He underwent TEE which showed LVEF 60-65%, no left atrial appendage thrombus, and trivial mitral valve regurgitation with no evidence of stenosis.  He tolerated the DCCV well and was discharged in stable condition on 01/30/2022. ? ?He contacted the office 02/01/2022 with reports of increased/elevated blood pressure.  He was started on hydrochlorothiazide 12.5 mg daily and instructed to have BMP drawn in 1 week. ? ?He presents to the clinic today for follow-up evaluation states he feels well.  He felt better instantly after having his cardioversion.  He reports that he is back to his normal activities.  We reviewed safety precautions for Eliquis.  He expressed understanding.  He  would like his diltiazem and apixaban to be sent to [facility].  We reviewed his CHA2DS2-VASc score.  His blood pressure is well controlled today.  He is now taking lisinopril 20 mg daily and his HCTZ.  I will give him the salty 6 diet sheet, continue his current medication regimen, and plan follow-up for 3 to 4 months.  He does have follow-up scheduled with Dr. Curt Bears. ? ?Today he denies chest pain, shortness of breath, lower extremity edema, fatigue, palpitations, melena, hematuria, hemoptysis, diaphoresis, weakness, presyncope, syncope, orthopnea, and PND. ? ? ?Home Medications  ?  ?Prior to Admission medications   ?Medication Sig Start Date End Date Taking? Authorizing Provider  ?apixaban (ELIQUIS) 5 MG TABS tablet Take 1 tablet (5 mg total) by mouth 2 (two) times daily. 01/30/22 04/30/22  Barb Merino, MD  ?Camphor-Menthol-Methyl Sal [facility] EX) Apply 1 patch topically daily as needed (pain).    [provider]  ?diltiazem (CARDIZEM CD) 120 MG 24 hr capsule Take 1 capsule (120 mg total) by mouth daily. 01/31/22 03/02/22  Barb Merino, MD  ?hydrochlorothiazide (MICROZIDE) 12.5 MG capsule Take 1 capsule (12.5 mg total) by mouth daily. 02/01/22 05/02/22  Deberah Pelton, NP  ?levothyroxine (SYNTHROID) 150 MCG tablet Take 150 mcg by mouth daily. 12/20/21   [provider]  ?lisinopril (ZESTRIL) 40 MG tablet Take 40 mg by mouth daily. 02/02/22   [provider]  ?metFORMIN (GLUCOPHAGE) 500 MG tablet Take 1 tablet (500 mg total) by mouth 2 (two) times daily with a meal. 01/30/22 01/30/23  Barb Merino, MD  ?pravastatin (  PRAVACHOL) 80 MG tablet Take 80 mg by mouth at bedtime. 01/23/22   [provider]  ? ? ?Family History  ?  ?Family History  ?Problem Relation Age of Onset  ? Cancer Father   ? ?He indicated that his mother is deceased. He indicated that his father is deceased. ? ?Social History  ?  ?Social History  ? ?Socioeconomic History  ? Marital status: Married  ?  Spouse  name: Not on file  ? Number of children: Not on file  ? Years of education: Not on file  ? Highest education level: Not on file  ?Occupational History  ? Not on file  ?Tobacco Use  ? Smoking status: Former  ?  Types: Cigarettes  ? Smokeless tobacco: Former  ?Substance and Sexual Activity  ? Alcohol use: Yes  ? Drug use: Never  ? Sexual activity: Not on file  ?Other Topics Concern  ? Not on file  ?Social History Narrative  ? Not on file  ? ?Social Determinants of Health  ? ?Financial Resource Strain: Not on file  ?Food Insecurity: Not on file  ?Transportation Needs: Not on file  ?Physical Activity: Not on file  ?Stress: Not on file  ?Social Connections: Not on file  ?Intimate Partner Violence: Not on file  ?  ? ?Review of Systems  ?  ?General:  No chills, fever, night sweats or weight changes.  ?Cardiovascular:  No chest pain, dyspnea on exertion, edema, orthopnea, palpitations, paroxysmal nocturnal dyspnea. ?Dermatological: No rash, lesions/masses ?Respiratory: No cough, dyspnea ?Urologic: No hematuria, dysuria ?Abdominal:   No nausea, vomiting, diarrhea, bright red blood per rectum, melena, or hematemesis ?Neurologic:  No visual changes, wkns, changes in mental status. ?All other systems reviewed and are otherwise negative except as noted above. ? ?Physical Exam  ?  ?VS:  BP 126/60 (BP Location: Left Arm, Patient Position: Sitting, Cuff Size: Normal)   Pulse (!) 46   Ht '6\' 3"'$  (1.905 m)   Wt 209 lb 9.6 oz (95.1 kg)   BMI 26.20 kg/m?  , BMI Body mass index is 26.2 kg/m?. ?GEN: Well nourished, well developed, in no acute distress. ?HEENT: normal. ?Neck: Supple, no JVD, carotid bruits, or masses. ?Cardiac: RRR, no murmurs, rubs, or gallops. No clubbing, cyanosis, edema.  Radials/DP/PT 2+ and equal bilaterally.  ?Respiratory:  Respirations regular and unlabored, clear to auscultation bilaterally. ?GI: Soft, nontender, nondistended, BS + x 4. ?MS: no deformity or atrophy. ?Skin: warm and dry, no rash. ?Neuro:   Strength and sensation are intact. ?Psych: Normal affect. ? ?Accessory Clinical Findings  ?  ?Recent Labs: ?01/27/2022: ALT 30 ?01/28/2022: TSH 8.764 ?01/29/2022: Magnesium 1.6 ?01/30/2022: Hemoglobin 15.1; Platelets 172 ?02/08/2022: BUN 25; Creatinine, Ser 1.14; Potassium 4.7; Sodium 135  ? ?Recent Lipid Panel ?   ?Component Value Date/Time  ? CHOL 125 01/29/2022 0031  ? TRIG 110 01/29/2022 0031  ? HDL 25 (L) 01/29/2022 0031  ? CHOLHDL 5.0 01/29/2022 0031  ? VLDL 22 01/29/2022 0031  ? [place] 78 01/29/2022 0031  ? ? ?ECG personally reviewed by me today-sinus bradycardia 46 bpm- No acute changes ? ?Echocardiogram 01/30/2029 ? ?IMPRESSIONS  ? ? ? 1. Left ventricular ejection fraction, by estimation, is 60 to 65%. The  ?left ventricle has normal function. The left ventricle has no regional  ?wall motion abnormalities.  ? 2. Right ventricular systolic function is normal. The right ventricular  ?size is normal.  ? 3. Left atrial size was mildly dilated. No left atrial/left atrial  ?appendage  thrombus was detected. The LAA emptying velocity was 60 cm/s.  ? 4. The mitral valve is normal in structure. Trivial mitral valve  ?regurgitation. No evidence of mitral stenosis.  ? 5. The aortic valve is normal in structure. Aortic valve regurgitation is  ?mild. No aortic stenosis is present.  ? 6. The inferior vena cava is normal in size with greater than 50%  ?respiratory variability, suggesting right atrial pressure of 3 mmHg. ? ?Assessment & Plan  ? ?1.  Atrial flutter-EKG today shows sinus bradycardia 46 bpm.  Denies recent episodes of increased or irregular heartbeat.  Cardiac unaware.  Reports compliance with Eliquis.  Denies bleeding issues. ?Continue Eliquis, diltiazem ?Heart healthy low-sodium diet ?Increase physical activity as tolerated ?Avoid triggers caffeine, chocolate, EtOH, dehydration etc. ?This patients CHA2DS2-VASc Score and unadjusted Ischemic Stroke Rate (% per year) is equal to 3.2 % stroke rate/year from a score  of 3 ? ?Above score calculated as 1 point each if present HTN, Above score calculated as 2 points each if present [Age > 75, or Stroke/TIA/TE] ? ? ?Essential hypertension-BP today 126/60.  Better controlled with

## 2022-02-13 ENCOUNTER — Encounter: Payer: Self-pay | Admitting: General Practice

## 2022-02-13 ENCOUNTER — Ambulatory Visit (INDEPENDENT_AMBULATORY_CARE_PROVIDER_SITE_OTHER): Payer: Medicare Other | Admitting: General Practice

## 2022-02-13 VITALS — BP 126/60 | HR 46 | Ht 75.0 in | Wt 209.6 lb

## 2022-02-13 DIAGNOSIS — E039 Hypothyroidism, unspecified: Secondary | ICD-10-CM

## 2022-02-13 DIAGNOSIS — E782 Mixed hyperlipidemia: Secondary | ICD-10-CM | POA: Diagnosis not present

## 2022-02-13 DIAGNOSIS — I10 Essential (primary) hypertension: Secondary | ICD-10-CM | POA: Diagnosis not present

## 2022-02-13 DIAGNOSIS — I483 Typical atrial flutter: Secondary | ICD-10-CM | POA: Diagnosis not present

## 2022-02-13 MED ORDER — DILTIAZEM HCL ER COATED BEADS 120 MG PO CP24: 120.0000 mg | ORAL_CAPSULE | Freq: Every day | ORAL | 1 refills | Status: DC

## 2022-02-13 MED ORDER — APIXABAN 5 MG PO TABS: 5.0000 mg | ORAL_TABLET | Freq: Two times a day (BID) | ORAL | 1 refills | Status: DC

## 2022-02-13 NOTE — Patient Instructions (Signed)
Medication Instructions:  ?The current medical regimen is effective;  continue present plan and medications as directed. Please refer to the Current Medication list given to you today.  ? ?*If you need a refill on your cardiac medications before your next appointment, please call your pharmacy* ? ?Lab Work:   Testing/Procedures:  ?NONE    NONE ? ?Special Instructions ?PLEASE INCREASE PHYSICAL ACTIVITY AS TOLERATED  ? ?Please try to avoid these triggers: ?Do not use any products that have nicotine or tobacco in them. These include cigarettes, e-cigarettes, and chewing tobacco. If you need help quitting, ask your doctor. ?Eat heart-healthy foods. Talk with your doctor about the right eating plan for you. ?Exercise regularly as told by your doctor. ?Stay hydrated ?Do not drink alcohol, Caffeine or chocolate. ?Lose weight if you are overweight. ?Do not use drugs, including cannabis ?  ?Follow-Up: ?Your next appointment:  4 month(s) In Person with Sanda Klein, MD :1 ? ?At [facility], you and your health needs are our priority.  As part of our continuing mission to provide you with exceptional heart care, we have created designated Provider Care Teams.  These Care Teams include your primary Cardiologist (physician) and Advanced Practice Providers (APPs -  Physician Assistants and Nurse Practitioners) who all work together to provide you with the care you need, when you need it. ? ? ?       ?

## 2022-02-15 ENCOUNTER — Ambulatory Visit (INDEPENDENT_AMBULATORY_CARE_PROVIDER_SITE_OTHER): Payer: Medicare Other | Admitting: Cardiology

## 2022-02-15 ENCOUNTER — Encounter: Payer: Self-pay | Admitting: Cardiology

## 2022-02-15 VITALS — BP 138/76 | HR 58 | Ht 75.0 in | Wt 210.6 lb

## 2022-02-15 DIAGNOSIS — I483 Typical atrial flutter: Secondary | ICD-10-CM

## 2022-02-15 NOTE — Progress Notes (Signed)
? ?Electrophysiology Office Note ? ? ?Date:  02/15/2022  ? ?ID:  Frederick Michael, DOB 07-01-1946, MRN [id number] ? ?PCP:  Frederick Pao, MD  ?Cardiologist: Frederick Michael ?Primary Electrophysiologist:  Frederick Michael Meredith Leeds, MD   ? ?Chief Complaint: Atrial flutter ?  ?History of Present Illness: ?Frederick Michael is a 76 y.o. male who is being seen today for the evaluation of atrial flutter at the request of Frederick Michael. Presenting today for electrophysiology evaluation. ? ?He has a history significant for hypertension and hyperlipidemia.  He presented to the hospital in atrial flutter.  He had a TEE and cardioversion which greatly improved his symptoms.  He was feeling weak, fatigued, short of breath.  That day, he had eaten a meal that was high in vinegar and thought that he initially had GI upset.  He checked his heart rate which was elevated at above 100 bpm.  After his cardioversion, he felt well.  He had no complaints. ? ?Today, he denies symptoms of palpitations, chest pain, shortness of breath, orthopnea, PND, lower extremity edema, claudication, dizziness, presyncope, syncope, bleeding, or neurologic sequela. The patient is tolerating medications without difficulties.  ? ? ?Past Medical History:  ?Diagnosis Date  ? DM2 (diabetes mellitus, type 2) (Frederick Michael)   ? High cholesterol   ? Hypertension   ? Thyroid disease   ? ?Past Surgical History:  ?Procedure Laterality Date  ? CARDIOVERSION N/A 01/30/2022  ? Procedure: CARDIOVERSION;  Surgeon: Frederick Klein, MD;  Location: [facility];  Service: Cardiovascular;  Laterality: N/A;  ? ELBOW SURGERY    ? TEE WITHOUT CARDIOVERSION N/A 01/30/2022  ? Procedure: TRANSESOPHAGEAL ECHOCARDIOGRAM (TEE);  Surgeon: Frederick Klein, MD;  Location: [facility];  Service: Cardiovascular;  Laterality: N/A;  ? ? ? ?Current Outpatient Medications  ?Medication Sig Dispense Refill  ? apixaban (ELIQUIS) 5 MG TABS tablet Take 1 tablet (5 mg total) by mouth 2 (two) times daily. 180  tablet 1  ? Camphor-Menthol-Methyl Sal (SALONPAS EX) Apply 1 patch topically daily as needed (pain).    ? diltiazem (CARDIZEM CD) 120 MG 24 hr capsule Take 1 capsule (120 mg total) by mouth daily. 90 capsule 1  ? hydrochlorothiazide (MICROZIDE) 12.5 MG capsule Take 1 capsule (12.5 mg total) by mouth daily. 90 capsule 3  ? levothyroxine (SYNTHROID) 150 MCG tablet Take 150 mcg by mouth daily.    ? lisinopril (ZESTRIL) 40 MG tablet Take 20 mg by mouth daily.    ? pravastatin (PRAVACHOL) 80 MG tablet Take 80 mg by mouth at bedtime.    ? ?No current facility-administered medications for this visit.  ? ? ?Allergies:   Patient has no known allergies.  ? ?Social History:  The patient  reports that he has quit smoking. His smoking use included cigarettes. He has quit using smokeless tobacco. He reports current alcohol use. He reports that he does not use drugs.  ? ?Family History:  The patient's family history includes Cancer in his father.  ? ? ?ROS:  Please see the history of present illness.   Otherwise, review of systems is positive for none.   All other systems are reviewed and negative.  ? ? ?PHYSICAL EXAM: ?VS:  BP 138/76   Pulse (!) 58   Ht '6\' 3"'$  (1.905 m)   Wt 210 lb 9.6 oz (95.5 kg)   SpO2 96%   BMI 26.32 kg/m?  , BMI Body mass index is 26.32 kg/m?. ?GEN: Well nourished, well developed, in no acute distress  ?HEENT: normal  ?  Neck: no JVD, carotid bruits, or masses ?Cardiac: RRR; no murmurs, rubs, or gallops,no edema  ?Respiratory:  clear to auscultation bilaterally, normal work of breathing ?GI: soft, nontender, nondistended, + BS ?MS: no deformity or atrophy  ?Skin: warm and dry ?Neuro:  Strength and sensation are intact ?Psych: euthymic mood, full affect ? ?EKG:  EKG is not ordered today. ?Personal review of the ekg ordered 01/27/22 shows atrial flutter ? ?Recent Labs: ?01/27/2022: ALT 30 ?01/28/2022: TSH 8.764 ?01/29/2022: Magnesium 1.6 ?01/30/2022: Hemoglobin 15.1; Platelets 172 ?02/08/2022: BUN 25; Creatinine,  Ser 1.14; Potassium 4.7; Sodium 135  ? ? ?Lipid Panel  ?   ?Component Value Date/Time  ? CHOL 125 01/29/2022 0031  ? TRIG 110 01/29/2022 0031  ? HDL 25 (L) 01/29/2022 0031  ? CHOLHDL 5.0 01/29/2022 0031  ? VLDL 22 01/29/2022 0031  ? [place] 78 01/29/2022 0031  ? ? ? ?Wt Readings from Last 3 Encounters:  ?02/15/22 210 lb 9.6 oz (95.5 kg)  ?02/13/22 209 lb 9.6 oz (95.1 kg)  ?01/27/22 207 lb (93.9 kg)  ?  ? ? ?Other studies Reviewed: ?Additional studies/ records that were reviewed today include: TTE 01/28/22  ?Review of the above records today demonstrates:  ? 1. Left ventricular ejection fraction, by estimation, is 60 to 65%. The  ?left ventricle has normal function. The left ventricle has no regional  ?wall motion abnormalities. There is mild left ventricular hypertrophy of  ?the basal-septal segment. Left  ?ventricular diastolic function could not be evaluated.  ? 2. Right ventricular systolic function is normal. The right ventricular  ?size is mildly enlarged.  ? 3. Left atrial size was mildly dilated.  ? 4. The mitral valve is normal in structure. No evidence of mitral valve  ?regurgitation. No evidence of mitral stenosis.  ? 5. The aortic valve is normal in structure. Aortic valve regurgitation is  ?not visualized. No aortic stenosis is present.  ? 6. The inferior vena cava is normal in size with greater than 50%  ?respiratory variability, suggesting right atrial pressure of 3 mmHg.  ? ? ?ASSESSMENT AND PLAN: ? ?1.  1.  Typical atrial flutter: Status post cardioversion.  CHA2DS2-VASc of 2.  Currently on Eliquis 5 mg twice daily.  He would prefer a rhythm control strategy.  Would prefer ablation.  Risk and benefits were discussed which include bleeding, tamponade, heart block, stroke, among others.  He understands these risks and is agreed to the procedure.  We Frederick Michael give him dates on procedure scheduling.  He would like to discuss this further with his wife. ? ?2.  Hypertension: Currently well controlled.  No  changes. ? ?3.  Hyperlipidemia: Continue pravastatin per primary physician. ? ? ? ?Current medicines are reviewed at length with the patient today.   ?The patient does not have concerns regarding his medicines.  The following changes were made today:  none ? ?Labs/ tests ordered today include:  ?No orders of the defined types were placed in this encounter. ? ? ? ?Disposition:   FU with Tiawanna Luchsinger 3 months ? ?Signed, ?Toccara Alford Meredith Leeds, MD  ?02/15/2022 4:09 PM    ? ?[facility] ?[address] ?[address] ?[place] [address] ?[phone] (office) ?[phone] (fax) ? ?

## 2022-02-15 NOTE — Patient Instructions (Signed)
Medication Instructions:  ?Your physician recommends that you continue on your current medications as directed. Please refer to the Current Medication list given to you today. ? ?*If you need a refill on your cardiac medications before your next appointment, please call your pharmacy* ? ? ?Lab Work: ?None ordered ? ? ?Testing/Procedures: ?Your physician has recommended that you have an ablation. Catheter ablation is a medical procedure used to treat some cardiac arrhythmias (irregular heartbeats). During catheter ablation, a long, thin, flexible tube is put into a blood vessel in your groin (upper thigh), or neck. This tube is called an ablation catheter. It is then guided to your heart through the blood vessel. Radio frequency waves destroy small areas of heart tissue where abnormal heartbeats may cause an arrhythmia to start. Please see the instruction sheet given to you today ? The following dates are available (these are subject to change): 7/5, 7/7, 7/11, 7/13, 7/26, 7/27 ? ? ?Follow-Up: ?At [facility], you and your health needs are our priority.  As part of our continuing mission to provide you with exceptional heart care, we have created designated Provider Care Teams.  These Care Teams include your primary Cardiologist (physician) and Advanced Practice Providers (APPs -  Physician Assistants and Nurse Practitioners) who all work together to provide you with the care you need, when you need it. ? ?Your next appointment:   ?To be   determined ? ?The format for your next appointment:   ?In Person ? ?Provider:   ?Allegra Lai, MD ? ? ? ?Thank you for choosing [facility]!! ? ? ?Trinidad Curet, RN ?[phone] ? ? ?Other Instructions ? ?Cardiac Ablation ?Cardiac ablation is a procedure to destroy (ablate) some heart tissue that is sending bad signals. These bad signals cause problems in heart rhythm. ?The heart has many areas that make these signals. If there are problems in these areas, they can make the  heart beat in a way that is not normal. Destroying some tissues can help make the heart rhythm normal. ?Tell your doctor about: ?Any allergies you have. ?All medicines you are taking. These include vitamins, herbs, eye drops, creams, and over-the-counter medicines. ?Any problems you or family members have had with medicines that make you fall asleep (anesthetics). ?Any blood disorders you have. ?Any surgeries you have had. ?Any medical conditions you have, such as kidney failure. ?Whether you are pregnant or may be pregnant. ?What are the risks? ?This is a safe procedure. But problems may occur, including: ?Infection. ?Bruising and bleeding. ?Bleeding into the chest. ?Stroke or blood clots. ?Damage to nearby areas of your body. ?Allergies to medicines or dyes. ?The need for a pacemaker if the normal system is damaged. ?Failure of the procedure to treat the problem. ?What happens before the procedure? ?Medicines ?Ask your doctor about: ?Changing or stopping your normal medicines. This is important. ?Taking aspirin and ibuprofen. Do not take these medicines unless your doctor tells you to take them. ?Taking other medicines, vitamins, herbs, and supplements. ?General instructions ?Follow instructions from your doctor about what you cannot eat or drink. ?Plan to have someone take you home from the hospital or clinic. ?If you will be going home right after the procedure, plan to have someone with you for 24 hours. ?Ask your doctor what steps will be taken to prevent infection. ?What happens during the procedure? ? ?An IV tube will be put into one of your veins. ?You will be given a medicine to help you relax. ?The skin on your  neck or groin will be numbed. ?A cut (incision) will be made in your neck or groin. A needle will be put through your cut and into a large vein. ?A tube (catheter) will be put into the needle. The tube will be moved to your heart. ?Dye may be put through the tube. This helps your doctor see your  heart. ?Small devices (electrodes) on the tube will send out signals. ?A type of energy will be used to destroy some heart tissue. ?The tube will be taken out. ?Pressure will be held on your cut. This helps stop bleeding. ?A bandage will be put over your cut. ?The exact procedure may vary among doctors and hospitals. ?What happens after the procedure? ?You will be watched until you leave the hospital or clinic. This includes checking your heart rate, breathing rate, oxygen, and blood pressure. ?Your cut will be watched for bleeding. You will need to lie still for a few hours. ?Do not drive for 24 hours or as long as your doctor tells you. ?Summary ?Cardiac ablation is a procedure to destroy some heart tissue. This is done to treat heart rhythm problems. ?Tell your doctor about any medical conditions you may have. Tell him or her about all medicines you are taking to treat them. ?This is a safe procedure. But problems may occur. These include infection, bruising, bleeding, and damage to nearby areas of your body. ?Follow what your doctor tells you about food and drink. You may also be told to change or stop some of your medicines. ?After the procedure, do not drive for 24 hours or as long as your doctor tells you. ?This information is not intended to replace advice given to you by your health care provider. Make sure you discuss any questions you have with your health care provider. ?Document Revised: 10/01/2019 Document Reviewed: 10/01/2019 ?Elsevier Patient Education ? [place]. ? ?

## 2022-02-16 ENCOUNTER — Telehealth: Payer: Self-pay | Admitting: Cardiology

## 2022-02-16 NOTE — Telephone Encounter (Signed)
Patient called to confirm the July 5th date.  ?

## 2022-02-16 NOTE — Telephone Encounter (Signed)
Left detailed message informing pt I would schedule him for 7/5 and be in touch at later time to go over instructions. ?Advised to call back if that date would not work. ?Informed that it may be weeks before I call him back with instructions. ? ?

## 2022-02-16 NOTE — Telephone Encounter (Signed)
Per message from patient scheduling:  ? ?"This message is to confirm some dates I was given today for an ablation.  The preferred dates ate 7/5, 7/11 or 7/13.  Thank you" ?

## 2022-04-17 ENCOUNTER — Telehealth: Payer: Self-pay | Admitting: Cardiology

## 2022-04-17 NOTE — Telephone Encounter (Signed)
New Message:      Patient just wanted to notify Dr Curt Bears that he had Covid. He tested on 04-13-22 and found this out. He is feeling better, not at 100%, but able to do a lot of  things at this time.

## 2022-04-20 ENCOUNTER — Inpatient Hospital Stay (HOSPITAL_COMMUNITY)
Admission: EM | Admit: 2022-04-20 | Discharge: 2022-04-23 | DRG: 872 | Disposition: A | Discharge status: Home / Self Care | Payer: Medicare Other | Attending: Internal Medicine | Admitting: Internal Medicine

## 2022-04-20 ENCOUNTER — Emergency Department (HOSPITAL_COMMUNITY): Payer: Medicare Other

## 2022-04-20 ENCOUNTER — Other Ambulatory Visit: Payer: Self-pay

## 2022-04-20 ENCOUNTER — Encounter (HOSPITAL_COMMUNITY): Payer: Self-pay

## 2022-04-20 DIAGNOSIS — E039 Hypothyroidism, unspecified: Secondary | ICD-10-CM | POA: Diagnosis not present

## 2022-04-20 DIAGNOSIS — A419 Sepsis, unspecified organism: Secondary | ICD-10-CM | POA: Diagnosis not present

## 2022-04-20 DIAGNOSIS — E1165 Type 2 diabetes mellitus with hyperglycemia: Secondary | ICD-10-CM | POA: Diagnosis present

## 2022-04-20 DIAGNOSIS — E7800 Pure hypercholesterolemia, unspecified: Secondary | ICD-10-CM | POA: Diagnosis present

## 2022-04-20 DIAGNOSIS — Z79899 Other long term (current) drug therapy: Secondary | ICD-10-CM | POA: Diagnosis not present

## 2022-04-20 DIAGNOSIS — R0689 Other abnormalities of breathing: Secondary | ICD-10-CM | POA: Diagnosis not present

## 2022-04-20 DIAGNOSIS — U071 COVID-19: Secondary | ICD-10-CM | POA: Diagnosis not present

## 2022-04-20 DIAGNOSIS — Z809 Family history of malignant neoplasm, unspecified: Secondary | ICD-10-CM

## 2022-04-20 DIAGNOSIS — E162 Hypoglycemia, unspecified: Secondary | ICD-10-CM | POA: Diagnosis not present

## 2022-04-20 DIAGNOSIS — Z0389 Encounter for observation for other suspected diseases and conditions ruled out: Secondary | ICD-10-CM | POA: Diagnosis not present

## 2022-04-20 DIAGNOSIS — K5730 Diverticulosis of large intestine without perforation or abscess without bleeding: Secondary | ICD-10-CM | POA: Diagnosis not present

## 2022-04-20 DIAGNOSIS — E038 Other specified hypothyroidism: Secondary | ICD-10-CM | POA: Diagnosis not present

## 2022-04-20 DIAGNOSIS — Z87891 Personal history of nicotine dependence: Secondary | ICD-10-CM

## 2022-04-20 DIAGNOSIS — Z7901 Long term (current) use of anticoagulants: Secondary | ICD-10-CM

## 2022-04-20 DIAGNOSIS — E161 Other hypoglycemia: Secondary | ICD-10-CM | POA: Diagnosis not present

## 2022-04-20 DIAGNOSIS — E119 Type 2 diabetes mellitus without complications: Secondary | ICD-10-CM

## 2022-04-20 DIAGNOSIS — Z8616 Personal history of COVID-19: Secondary | ICD-10-CM

## 2022-04-20 DIAGNOSIS — Z79890 Hormone replacement therapy (postmenopausal): Secondary | ICD-10-CM

## 2022-04-20 DIAGNOSIS — I10 Essential (primary) hypertension: Secondary | ICD-10-CM | POA: Diagnosis not present

## 2022-04-20 DIAGNOSIS — R6520 Severe sepsis without septic shock: Secondary | ICD-10-CM | POA: Diagnosis present

## 2022-04-20 DIAGNOSIS — N200 Calculus of kidney: Secondary | ICD-10-CM | POA: Diagnosis not present

## 2022-04-20 DIAGNOSIS — I483 Typical atrial flutter: Secondary | ICD-10-CM | POA: Diagnosis not present

## 2022-04-20 DIAGNOSIS — K8689 Other specified diseases of pancreas: Secondary | ICD-10-CM | POA: Diagnosis not present

## 2022-04-20 DIAGNOSIS — N390 Urinary tract infection, site not specified: Secondary | ICD-10-CM | POA: Diagnosis not present

## 2022-04-20 DIAGNOSIS — E872 Acidosis, unspecified: Secondary | ICD-10-CM | POA: Diagnosis present

## 2022-04-20 DIAGNOSIS — E785 Hyperlipidemia, unspecified: Secondary | ICD-10-CM | POA: Diagnosis present

## 2022-04-20 DIAGNOSIS — N3000 Acute cystitis without hematuria: Secondary | ICD-10-CM | POA: Diagnosis present

## 2022-04-20 DIAGNOSIS — R9431 Abnormal electrocardiogram [ECG] [EKG]: Secondary | ICD-10-CM | POA: Diagnosis not present

## 2022-04-20 DIAGNOSIS — M549 Dorsalgia, unspecified: Secondary | ICD-10-CM | POA: Diagnosis not present

## 2022-04-20 LAB — URINALYSIS, ROUTINE W REFLEX MICROSCOPIC
Bilirubin Urine: NEGATIVE
Glucose, UA: NEGATIVE mg/dL
Ketones, ur: NEGATIVE mg/dL
Nitrite: POSITIVE — AB
Protein, ur: 100 mg/dL — AB
Specific Gravity, Urine: 1.025 (ref 1.005–1.030)
WBC, UA: >50 WBC/hpf — ABNORMAL HIGH (ref 0–5)
pH: 5 (ref 5.0–8.0)

## 2022-04-20 LAB — COMPREHENSIVE METABOLIC PANEL
ALT: 26 U/L (ref 0–44)
AST: 31 U/L (ref 15–41)
Albumin: 3.8 g/dL (ref 3.5–5.0)
Alkaline Phosphatase: 51 U/L (ref 38–126)
Anion gap: 17 — ABNORMAL HIGH (ref 5–15)
BUN: 23 mg/dL (ref 8–23)
CO2: 19 mmol/L — ABNORMAL LOW (ref 22–32)
Calcium: 9.1 mg/dL (ref 8.9–10.3)
Chloride: 102 mmol/L (ref 98–111)
Creatinine, Ser: 1.23 mg/dL (ref 0.61–1.24)
GFR, Estimated: >60 mL/min (ref >60)
Glucose, Bld: 191 mg/dL — ABNORMAL HIGH (ref 70–99)
Potassium: 3.5 mmol/L (ref 3.5–5.1)
Sodium: 138 mmol/L (ref 135–145)
Total Bilirubin: 1.5 mg/dL — ABNORMAL HIGH (ref 0.3–1.2)
Total Protein: 7.4 g/dL (ref 6.5–8.1)

## 2022-04-20 LAB — LACTIC ACID, PLASMA
Lactic Acid, Venous: 4.5 mmol/L (ref 0.5–1.9)
Lactic Acid, Venous: 2.4 mmol/L (ref 0.5–1.9)

## 2022-04-20 LAB — GLUCOSE, CAPILLARY
Glucose-Capillary: 225 mg/dL — ABNORMAL HIGH (ref 70–99)
Glucose-Capillary: 236 mg/dL — ABNORMAL HIGH (ref 70–99)

## 2022-04-20 LAB — CBC WITH DIFFERENTIAL/PLATELET
Abs Immature Granulocytes: 0.09 10*3/uL — ABNORMAL HIGH (ref 0.00–0.07)
Basophils Absolute: 0 10*3/uL (ref 0.0–0.1)
Basophils Relative: 0 %
Eosinophils Absolute: 0 10*3/uL (ref 0.0–0.5)
Eosinophils Relative: 0 %
HCT: 46.2 % (ref 39.0–52.0)
Hemoglobin: 15.6 g/dL (ref 13.0–17.0)
Immature Granulocytes: 1 %
Lymphocytes Relative: 4 %
Lymphs Abs: 0.4 10*3/uL — ABNORMAL LOW (ref 0.7–4.0)
MCH: 30.2 pg (ref 26.0–34.0)
MCHC: 33.8 g/dL (ref 30.0–36.0)
MCV: 89.4 fL (ref 80.0–100.0)
Monocytes Absolute: 0.3 10*3/uL (ref 0.1–1.0)
Monocytes Relative: 3 %
Neutro Abs: 8.3 10*3/uL — ABNORMAL HIGH (ref 1.7–7.7)
Neutrophils Relative %: 92 %
Platelets: 141 10*3/uL — ABNORMAL LOW (ref 150–400)
RBC: 5.17 MIL/uL (ref 4.22–5.81)
RDW: 13.4 % (ref 11.5–15.5)
WBC: 9.1 10*3/uL (ref 4.0–10.5)
nRBC: 0 % (ref 0.0–0.2)

## 2022-04-20 LAB — PROTIME-INR
INR: 1.5 — ABNORMAL HIGH (ref 0.8–1.2)
Prothrombin Time: 17.6 seconds — ABNORMAL HIGH (ref 11.4–15.2)

## 2022-04-20 LAB — APTT: aPTT: 35 seconds (ref 24–36)

## 2022-04-20 MED ORDER — LACTATED RINGERS IV BOLUS (SEPSIS)
1000.0000 mL | Freq: Once | INTRAVENOUS | Status: AC
  Administered 2022-04-20: 1000 mL via INTRAVENOUS

## 2022-04-20 MED ORDER — PRAVASTATIN SODIUM 20 MG PO TABS
80.0000 mg | ORAL_TABLET | Freq: Every day | ORAL | Status: DC
  Administered 2022-04-20 – 2022-04-22 (×3): 80 mg via ORAL
  Filled 2022-04-20 (×3): qty 4

## 2022-04-20 MED ORDER — MAGNESIUM SULFATE 2 GM/50ML IV SOLN
2.0000 g | Freq: Once | INTRAVENOUS | Status: AC
  Administered 2022-04-20: 2 g via INTRAVENOUS
  Filled 2022-04-20: qty 50

## 2022-04-20 MED ORDER — ACETAMINOPHEN 500 MG PO TABS
1000.0000 mg | ORAL_TABLET | Freq: Once | ORAL | Status: AC
  Administered 2022-04-20: 1000 mg via ORAL
  Filled 2022-04-20: qty 2

## 2022-04-20 MED ORDER — APIXABAN 5 MG PO TABS
5.0000 mg | ORAL_TABLET | Freq: Two times a day (BID) | ORAL | Status: DC
  Administered 2022-04-20 – 2022-04-23 (×6): 5 mg via ORAL
  Filled 2022-04-20 (×6): qty 1

## 2022-04-20 MED ORDER — SODIUM CHLORIDE 0.9 % IV SOLN
2.0000 g | INTRAVENOUS | Status: DC
  Administered 2022-04-20 – 2022-04-22 (×3): 2 g via INTRAVENOUS
  Filled 2022-04-20 (×3): qty 20

## 2022-04-20 MED ORDER — INSULIN ASPART 100 UNIT/ML IJ SOLN
0.0000 [IU] | Freq: Three times a day (TID) | INTRAMUSCULAR | Status: DC
  Administered 2022-04-21: 1 [IU] via SUBCUTANEOUS
  Administered 2022-04-21: 2 [IU] via SUBCUTANEOUS
  Administered 2022-04-21: 3 [IU] via SUBCUTANEOUS
  Administered 2022-04-22 – 2022-04-23 (×4): 1 [IU] via SUBCUTANEOUS
  Filled 2022-04-20: qty 0.09

## 2022-04-20 MED ORDER — INSULIN ASPART 100 UNIT/ML IJ SOLN
0.0000 [IU] | Freq: Every day | INTRAMUSCULAR | Status: DC
  Administered 2022-04-20: 2 [IU] via SUBCUTANEOUS
  Filled 2022-04-20: qty 0.05

## 2022-04-20 MED ORDER — LACTATED RINGERS IV BOLUS
1000.0000 mL | Freq: Once | INTRAVENOUS | Status: AC
  Administered 2022-04-20: 1000 mL via INTRAVENOUS

## 2022-04-20 MED ORDER — MOLNUPIRAVIR EUA 200MG CAPSULE: 4.0000 | ORAL_CAPSULE | Freq: Once | ORAL | Status: DC

## 2022-04-20 MED ORDER — ACETAMINOPHEN 650 MG RE SUPP: 650.0000 mg | Freq: Four times a day (QID) | RECTAL | Status: DC | PRN

## 2022-04-20 MED ORDER — DILTIAZEM HCL ER COATED BEADS 120 MG PO CP24
120.0000 mg | ORAL_CAPSULE | Freq: Every day | ORAL | Status: DC
  Administered 2022-04-21 – 2022-04-22 (×2): 120 mg via ORAL
  Filled 2022-04-20 (×3): qty 1

## 2022-04-20 MED ORDER — ACETAMINOPHEN 325 MG PO TABS: 650.0000 mg | ORAL_TABLET | Freq: Four times a day (QID) | ORAL | Status: DC | PRN

## 2022-04-20 MED ORDER — LEVOTHYROXINE SODIUM 50 MCG PO TABS
150.0000 ug | ORAL_TABLET | Freq: Every day | ORAL | Status: DC
  Administered 2022-04-21 – 2022-04-23 (×3): 150 ug via ORAL
  Filled 2022-04-20 (×3): qty 1

## 2022-04-20 MED ORDER — LACTATED RINGERS IV SOLN: INTRAVENOUS | Status: AC

## 2022-04-20 NOTE — Plan of Care (Signed)

## 2022-04-20 NOTE — ED Notes (Signed)
Pt's wife will bring in pt's personal medication of Lagevrio as pharmacy does not have this medication on hand. Pharmacy has spoken with Dr. Regenia Skeeter regarding okay for pt to take his own medication.

## 2022-04-20 NOTE — Assessment & Plan Note (Addendum)
New. QTc 516 msec. Will give IV mag 2 grams. Repeat EKG in AM.

## 2022-04-20 NOTE — Assessment & Plan Note (Addendum)
Due to UTI. Evidenced by elevated lactic acid. Repeat CBC in AM.  Sepsis with acute organ dysfunction is a Acute illness/condition that poses a threat to life or bodily function.

## 2022-04-20 NOTE — Assessment & Plan Note (Signed)
Stable. Will hold hctz while giving IVF. Continue with cardizem cd 120 mg. Hold lisinopril for his UTI. He may have had early pyelo given his severe back pain but negative CT urogram.

## 2022-04-20 NOTE — ED Notes (Signed)
Secure message sent to receiving RN Glenna Fellows with inpatient bed, awaiting response

## 2022-04-20 NOTE — ED Triage Notes (Signed)
Pt BIB EMS from home. Patient diagnosed with COVID one week ago. Denies SHOB and chest pain. C/o mid lower back pain that radiates into the left leg. Patient also c/o painful urination for 2 days. Patient states a decrease in oral intake for about one week.

## 2022-04-20 NOTE — Assessment & Plan Note (Signed)
Pt on his last day of molnupiravir. Continue with isolation status as it has only been 6 days since his diagnosis.

## 2022-04-20 NOTE — Assessment & Plan Note (Addendum)
Admit to observation telemetry bed. Continue with IV rocephin 2 grams. Given his high fever of 104, would not be surprised if his blood cx were positive for GNR. Continue with IVF LR @ 100 ml/hr x 10 hours. Repeat CBC, CMP in AM.  Acute cystitis without hematuria is a Acute illness/condition that poses a threat to life or bodily function.

## 2022-04-20 NOTE — H&P (Addendum)
History and Physical    Frederick Michael XBM:[id number] DOB: 12-15-1945 DOA: 04/20/2022  DOS: the patient was seen and examined on 04/20/2022  PCP: Tisovec, Fransico Him, MD   Patient coming from: Home  I have personally briefly reviewed patient's old medical records in [place]  CC: fever, back pain HPI: 76 year old male history of diet-controlled type 2 diabetes, a flutter on Eliquis and Cardizem, hypertension, hyperlipidemia, recent COVID-19 infection about 6 days ago presents to the ER today with a 1 day history of fever and back pain.  Patient was diagnosed with COVID about 6 days ago.  He took a home test.  He called his primary care physician.  He has been on molnupiravir for the last 4 days.  His last dose is tonight.  He developed severe back pain along with dysuria last night.  He started having fevers today.  On arrival to the ER, initial temperature was 102 and increased to 104.5.  Heart rate 86.  Blood pressure 114/62.  Labs showed a white count of 9.1, hemoglobin 15.6, platelets of 141  Initial lactic acid was elevated at 4.5.  Urinalysis showed large blood, 21-50 RBCs, nitrate positive, leukocyte is positive, bacteria many.  Sodium 138, potassium 3.5 chloride 102, bicarb 19, BUN 25, creatinine 1.23  Blood and urine cultures were obtained.  CT urogram was negative for pyelonephritis.  He has small nonobstructive kidney stone.  There was a 6 cm stool ball in the rectum.  Patient states he has not had a bowel movement in a least 1 day.  To the patient's sepsis, UTI, [facility] hospital[facility] contacted for admission.   ED Course: Febrile to 104.  CT urogram negative.  Started on IV Rocephin.  Review of Systems:  Review of Systems  Constitutional:  Positive for chills, fever and malaise/fatigue.  HENT: Negative.    Eyes: Negative.   Respiratory: Negative.    Cardiovascular: Negative.   Gastrointestinal: Negative.   Genitourinary:  Positive for dysuria, flank pain,  frequency and urgency.  Musculoskeletal:  Positive for back pain and myalgias.  Skin: Negative.   Neurological: Negative.   Endo/Heme/Allergies: Negative.   Psychiatric/Behavioral: Negative.    All other systems reviewed and are negative.   Past Medical History:  Diagnosis Date   DM2 (diabetes mellitus, type 2) ([place])    High cholesterol    Hypertension    Thyroid disease     Past Surgical History:  Procedure Laterality Date   CARDIOVERSION N/A 01/30/2022   Procedure: CARDIOVERSION;  Surgeon: Sanda Klein, MD;  Location: [place];  Service: Cardiovascular;  Laterality: N/A;   ELBOW SURGERY     TEE WITHOUT CARDIOVERSION N/A 01/30/2022   Procedure: TRANSESOPHAGEAL ECHOCARDIOGRAM (TEE);  Surgeon: Sanda Klein, MD;  Location: [facility] ENDOSCOPY;  Service: Cardiovascular;  Laterality: N/A;     reports that he has quit smoking. His smoking use included cigarettes. He has quit using smokeless tobacco. He reports current alcohol use of about 3.0 standard drinks of alcohol per week. He reports that he does not use drugs.  No Known Allergies  Family History  Problem Relation Age of Onset   Cancer Father     Prior to Admission medications   Medication Sig Start Date End Date Taking? Authorizing Provider  apixaban (ELIQUIS) 5 MG TABS tablet Take 1 tablet (5 mg total) by mouth 2 (two) times daily. 02/13/22 05/14/22  Deberah Pelton, NP  Camphor-Menthol-Methyl Sal [facility] EX) Apply 1 patch topically daily as needed (pain).  [provider]  diltiazem (CARDIZEM CD) 120 MG 24 hr capsule Take 1 capsule (120 mg total) by mouth daily. 02/13/22 03/15/22  Deberah Pelton, NP  hydrochlorothiazide (MICROZIDE) 12.5 MG capsule Take 1 capsule (12.5 mg total) by mouth daily. 02/01/22 05/02/22  Deberah Pelton, NP  levothyroxine (SYNTHROID) 150 MCG tablet Take 150 mcg by mouth daily. 12/20/21   [provider]  lisinopril (ZESTRIL) 40 MG tablet Take 20 mg by mouth daily. 02/02/22    [provider]  pravastatin (PRAVACHOL) 80 MG tablet Take 80 mg by mouth at bedtime. 01/23/22   [provider]    Physical Exam: Vitals:   04/20/22 1815 04/20/22 1830 04/20/22 1845 04/20/22 1934  BP: (!) 112/55 116/67 (!) 106/49 (!) 102/57  Pulse: 86 89 73 65  Resp: (!) 27 (!) 22 (!) 26 14  Temp:    98.8 F (37.1 C)  TempSrc:    Oral  SpO2: 96% 96% 96% 98%  Weight:      Height:        Physical Exam Vitals and nursing note reviewed.  Constitutional:      General: He is not in acute distress.    Appearance: Normal appearance. He is normal weight. He is not ill-appearing, toxic-appearing or diaphoretic.  HENT:     Head: Normocephalic and atraumatic.     Nose: Nose normal.  Cardiovascular:     Rate and Rhythm: Normal rate and regular rhythm.     Pulses: Normal pulses.  Pulmonary:     Effort: Pulmonary effort is normal. No respiratory distress.     Breath sounds: Normal breath sounds. No wheezing or rales.  Abdominal:     General: Abdomen is flat. Bowel sounds are normal. There is no distension.     Palpations: Abdomen is soft.     Tenderness: There is no abdominal tenderness. There is no guarding.     Hernia: No hernia is present.  Musculoskeletal:     Right lower leg: No edema.     Left lower leg: No edema.  Skin:    General: Skin is warm and dry.     Capillary Refill: Capillary refill takes less than 2 seconds.  Neurological:     General: No focal deficit present.     Mental Status: He is alert and oriented to person, place, and time.      Labs on Admission: I have personally reviewed following labs and imaging studies  CBC: Recent Labs  Lab 04/20/22 1830  WBC 9.1  NEUTROABS 8.3*  HGB 15.6  HCT 46.2  MCV 89.4  PLT 811*   Basic Metabolic Panel: Recent Labs  Lab 04/20/22 1830  NA 138  K 3.5  CL 102  CO2 19*  GLUCOSE 191*  BUN 23  CREATININE 1.23  CALCIUM 9.1   GFR: Estimated Creatinine Clearance: 62 mL/min (by C-G formula  based on SCr of 1.23 mg/dL). Liver Function Tests: Recent Labs  Lab 04/20/22 1830  AST 31  ALT 26  ALKPHOS 51  BILITOT 1.5*  PROT 7.4  ALBUMIN 3.8   No results for input(s): "LIPASE", "AMYLASE" in the last 168 hours. No results for input(s): "AMMONIA" in the last 168 hours. Coagulation Profile: Recent Labs  Lab 04/20/22 1840  INR 1.5*   Cardiac Enzymes: No results for input(s): "CKTOTAL", "CKMB", "CKMBINDEX", "TROPONINI", "TROPONINIHS" in the last 168 hours. BNP (last 3 results) No results for input(s): "PROBNP" in the last 8760 hours. HbA1C: No results for input(s): "HGBA1C"  in the last 72 hours. CBG: No results for input(s): "GLUCAP" in the last 168 hours. Lipid Profile: No results for input(s): "CHOL", "HDL", "LDLCALC", "TRIG", "CHOLHDL", "LDLDIRECT" in the last 72 hours. Thyroid Function Tests: No results for input(s): "TSH", "T4TOTAL", "FREET4", "T3FREE", "THYROIDAB" in the last 72 hours. Anemia Panel: No results for input(s): "VITAMINB12", "FOLATE", "FERRITIN", "TIBC", "IRON", "RETICCTPCT" in the last 72 hours. Urine analysis:    Component Value Date/Time   COLORURINE AMBER (A) 04/20/2022 1842   APPEARANCEUR CLOUDY (A) 04/20/2022 1842   LABSPEC 1.025 04/20/2022 1842   PHURINE 5.0 04/20/2022 1842   GLUCOSEU NEGATIVE 04/20/2022 1842   HGBUR LARGE (A) 04/20/2022 1842   BILIRUBINUR NEGATIVE 04/20/2022 1842   KETONESUR NEGATIVE 04/20/2022 1842   PROTEINUR 100 (A) 04/20/2022 1842   NITRITE POSITIVE (A) 04/20/2022 1842   LEUKOCYTESUR MODERATE (A) 04/20/2022 1842    Radiological Exams on Admission: I have personally reviewed images DG Chest Port 1 View  Result Date: 04/20/2022 CLINICAL DATA:  Questionable sepsis - evaluate for abnormality EXAM: PORTABLE CHEST 1 VIEW COMPARISON:  Chest x-ray 01/27/2022 FINDINGS: The heart and mediastinal contours are unchanged. No focal consolidation. No pulmonary edema. No pleural effusion. No pneumothorax. No acute osseous  abnormality. IMPRESSION: No active disease. Electronically Signed   By: Iven Finn M.D.   On: 04/20/2022 19:22   CT Renal Stone Study  Result Date: 04/20/2022 CLINICAL DATA:  Flank pain, kidney stone suspected EXAM: CT ABDOMEN AND PELVIS WITHOUT CONTRAST TECHNIQUE: Multidetector CT imaging of the abdomen and pelvis was performed following the standard protocol without IV contrast. RADIATION DOSE REDUCTION: This exam was performed according to the departmental dose-optimization program which includes automated exposure control, adjustment of the mA and/or kV according to patient size and/or use of iterative reconstruction technique. COMPARISON:  None Available. FINDINGS: Lower chest: 4 mm pulmonary nodule within the right lower lobe. Tiny hiatal hernia. Otherwise no acute abnormality. Hepatobiliary: No focal liver abnormality. No gallstones, gallbladder wall thickening, or pericholecystic fluid. No biliary dilatation. Pancreas: Diffusely atrophic. No focal lesion. Otherwise normal pancreatic contour. No surrounding inflammatory changes. No main pancreatic ductal dilatation. Spleen: Punctate calcifications likely sequelae of prior granulomatous disease. Otherwise normal in size without focal abnormality. Adrenals/Urinary Tract: No adrenal nodule bilaterally. Bilateral renal cortical scarring. Punctate right nephrolithiasis. No left nephrolithiasis. There is a fluid dense lesion within the right kidney that statistically likely represents a simple renal cyst. Simple renal cysts, in the absence of clinically indicated signs/symptoms, require no independent follow-up. No hydronephrosis. No definite contour-deforming renal mass. No ureterolithiasis or hydroureter. Decompressed urinary bladder with urinary bladder wall haziness and perivascular fat stranding. Stomach/Bowel: Stomach is within normal limits. No evidence of bowel wall thickening or dilatation. Scattered colonic diverticulosis. 6-7 cm stool ball within  the rectum. The appendix appears normal. Vascular/Lymphatic: No abdominal aorta or iliac aneurysm. Mild atherosclerotic plaque of the aorta and its branches. No abdominal, pelvic, or inguinal lymphadenopathy. Reproductive: Prostate is unremarkable. Other: No intraperitoneal free fluid. No intraperitoneal free gas. No organized fluid collection. Musculoskeletal: No abdominal wall hernia or abnormality. No suspicious lytic or blastic osseous lesions. No acute displaced fracture. Intervertebral disc space vacuum phenomenon. IMPRESSION: 1. Decompressed urinary bladder with urinary bladder wall haziness and trace perivascular fat stranding. Consider correlation with urinalysis to exclude infection. 2. Nonobstructive right punctate nephrolithiasis. 3. Colonic diverticulosis with no acute diverticulitis. 4. A 6-7 cm stool ball within the rectum. 5. A 4 mm right lower lobe pulmonary nodule. No follow-up needed if patient is  low-risk.This recommendation follows the consensus statement: Guidelines for Management of Incidental Pulmonary Nodules Detected on CT Images: From the Fleischner Society 2017; Radiology 2017; 284:228-243. 6.  Aortic Atherosclerosis (ICD10-I70.0). Electronically Signed   By: Iven Finn M.D.   On: 04/20/2022 19:20    EKG: My personal interpretation of EKG shows: NSR, Qtc 516 msec      Assessment/Plan Principal Problem:   Acute cystitis without hematuria Active Problems:   Sepsis with acute organ dysfunction without septic shock (HCC)   COVID-19 virus infection   Typical atrial flutter (HCC)   HTN (hypertension)   DM2 (diabetes mellitus, type 2) (HCC)   HLD (hyperlipidemia)   Hypothyroidism   Prolonged QT interval    Assessment and Plan: * Acute cystitis without hematuria Admit to observation telemetry bed. Continue with IV rocephin 2 grams. Given his high fever of 104, would not be surprised if his blood cx were positive for GNR. Continue with IVF LR @ 100 ml/hr x 10 hours.  Repeat CBC, CMP in AM.  Acute cystitis without hematuria is a Acute illness/condition that poses a threat to life or bodily function.   COVID-19 virus infection Pt on his last day of molnupiravir. Continue with isolation status as it has only been 6 days since his diagnosis.  Sepsis with acute organ dysfunction without septic shock (HCC) Due to UTI. Evidenced by elevated lactic acid. Repeat CBC in AM.  Sepsis with acute organ dysfunction is a Acute illness/condition that poses a threat to life or bodily function.  HLD (hyperlipidemia) Stable. Continue pravachol 80 mg qd.  DM2 (diabetes mellitus, type 2) (HCC) Stable. Pt is diet controlled. Add SSI.  HTN (hypertension) Stable. Will hold hctz while giving IVF. Continue with cardizem cd 120 mg. Hold lisinopril for his UTI. He may have had early pyelo given his severe back pain but negative CT urogram.  Typical atrial flutter (HCC) Stable. In NSR. Continue cardizem CD 120 mg, eliquis 5 mg bid.  Prolonged QT interval New. QTc 516 msec. Will give IV mag 2 grams. Repeat EKG in AM.   DVT prophylaxis: Eliquis Code Status: Full Code Family Communication: no family at bedside  Disposition Plan: return home  Consults called: none  Admission status: Observation, Telemetry bed   Kristopher Oppenheim, DO [facility] 04/20/2022, 8:20 PM

## 2022-04-20 NOTE — ED Provider Notes (Addendum)
[facility] DEPT Provider Note   CSN: [id number] Arrival date & time: 04/20/22  1735     History  No chief complaint on file.   Frederick Michael is a 76 y.o. male.  HPI 76 year old male presents with fever and back pain. On 6/2 he developed mild URI symptoms (cough) and tested positive for covid. He's done well since then. No fevers. On 6/5 he had some mild low back pain. Resolved on its own. Yesterday started getting back pain again (low right back) with radiation down both posterior legs to knees.  Today the pain was intractable.  He developed a fever this afternoon.  He took some Aleve for that.  When EMS arrived he states he was in severe pain but now that he is in the emergency department he states his pain is completely gone.  He has no urinary or bowel incontinence but he has been noticing some dysuria since yesterday.  Some urinary frequency as well.  No headache, shortness of breath, chest pain, abdominal pain.  No vomiting or weakness/numbness in the extremities. He has been vaccinated against covid.  Home Medications Prior to Admission medications   Medication Sig Start Date End Date Taking? Authorizing Provider  apixaban (ELIQUIS) 5 MG TABS tablet Take 1 tablet (5 mg total) by mouth 2 (two) times daily. 02/13/22 05/14/22  Deberah Pelton, NP  Camphor-Menthol-Methyl Sal [facility] EX) Apply 1 patch topically daily as needed (pain).    [provider]  diltiazem (CARDIZEM CD) 120 MG 24 hr capsule Take 1 capsule (120 mg total) by mouth daily. 02/13/22 03/15/22  Deberah Pelton, NP  hydrochlorothiazide (MICROZIDE) 12.5 MG capsule Take 1 capsule (12.5 mg total) by mouth daily. 02/01/22 05/02/22  Deberah Pelton, NP  levothyroxine (SYNTHROID) 150 MCG tablet Take 150 mcg by mouth daily. 12/20/21   [provider]  lisinopril (ZESTRIL) 40 MG tablet Take 20 mg by mouth daily. 02/02/22   [provider]  pravastatin (PRAVACHOL) 80 MG tablet  Take 80 mg by mouth at bedtime. 01/23/22   [provider]      Allergies    Patient has no known allergies.    Review of Systems   Review of Systems  Constitutional:  Positive for fever.  Respiratory:  Positive for cough. Negative for shortness of breath.   Cardiovascular:  Negative for chest pain.  Gastrointestinal:  Negative for abdominal pain, diarrhea and vomiting.  Genitourinary:  Positive for dysuria.  Musculoskeletal:  Positive for back pain.  Neurological:  Negative for weakness, numbness and headaches.    Physical Exam Updated Vital Signs BP (!) 112/55   Pulse 86   Temp (!) 104.5 F (40.3 C) (Rectal)   Resp (!) 27   Ht '6\' 3"'$  (1.905 m)   Wt 92.5 kg   SpO2 96%   BMI 25.50 kg/m  Physical Exam Vitals and nursing note reviewed.  Constitutional:      Appearance: He is well-developed.  HENT:     Head: Normocephalic and atraumatic.  Cardiovascular:     Rate and Rhythm: Normal rate and regular rhythm.     Heart sounds: Normal heart sounds.  Pulmonary:     Effort: Pulmonary effort is normal.     Breath sounds: Normal breath sounds.  Abdominal:     General: There is no distension.     Palpations: Abdomen is soft.     Tenderness: There is no abdominal tenderness. There is no right CVA tenderness  or left CVA tenderness.  Musculoskeletal:     Thoracic back: No tenderness or bony tenderness.     Lumbar back: No tenderness or bony tenderness.  Skin:    General: Skin is warm and dry.  Neurological:     Mental Status: He is alert.     Comments: 5/5 strength in BLE. Grossly normal sensation     ED Results / Procedures / Treatments   Labs (all labs ordered are listed, but only abnormal results are displayed) Labs Reviewed  LACTIC ACID, PLASMA - Abnormal; Notable for the following components:      Result Value   Lactic Acid, Venous 4.5 (*)    All other components within normal limits  LACTIC ACID, PLASMA - Abnormal; Notable for the following components:    Lactic Acid, Venous 2.4 (*)    All other components within normal limits  COMPREHENSIVE METABOLIC PANEL - Abnormal; Notable for the following components:   CO2 19 (*)    Glucose, Bld 191 (*)    Total Bilirubin 1.5 (*)    Anion gap 17 (*)    All other components within normal limits  CBC WITH DIFFERENTIAL/PLATELET - Abnormal; Notable for the following components:   Platelets 141 (*)    Neutro Abs 8.3 (*)    Lymphs Abs 0.4 (*)    Abs Immature Granulocytes 0.09 (*)    All other components within normal limits  URINALYSIS, ROUTINE W REFLEX MICROSCOPIC - Abnormal; Notable for the following components:   Color, Urine AMBER (*)    APPearance CLOUDY (*)    Hgb urine dipstick LARGE (*)    Protein, ur 100 (*)    Nitrite POSITIVE (*)    Leukocytes,Ua MODERATE (*)    WBC, UA >50 (*)    Bacteria, UA MANY (*)    All other components within normal limits  PROTIME-INR - Abnormal; Notable for the following components:   Prothrombin Time 17.6 (*)    INR 1.5 (*)    All other components within normal limits  GLUCOSE, CAPILLARY - Abnormal; Notable for the following components:   Glucose-Capillary 225 (*)    All other components within normal limits  CULTURE, BLOOD (ROUTINE X 2)  CULTURE, BLOOD (ROUTINE X 2)  URINE CULTURE  APTT  CBC WITH DIFFERENTIAL/PLATELET  COMPREHENSIVE METABOLIC PANEL  MAGNESIUM    EKG EKG Interpretation  Date/Time:  Friday April 20 2022 18:01:20 EDT Ventricular Rate:  86 PR Interval:  153 QRS Duration: 103 QT Interval:  431 QTC Calculation: 516 R Axis:   57 Text Interpretation: Sinus rhythm Prolonged QT interval Baseline wander in lead(s) II III aVR aVF V1 V3 V4 V5 V6 Confirmed by Sherwood Gambler [phone] on 04/20/2022 6:57:15 PM  Radiology DG Chest Port 1 View  Result Date: 04/20/2022 CLINICAL DATA:  Questionable sepsis - evaluate for abnormality EXAM: PORTABLE CHEST 1 VIEW COMPARISON:  Chest x-ray 01/27/2022 FINDINGS: The heart and mediastinal contours are  unchanged. No focal consolidation. No pulmonary edema. No pleural effusion. No pneumothorax. No acute osseous abnormality. IMPRESSION: No active disease. Electronically Signed   By: Iven Finn M.D.   On: 04/20/2022 19:22   CT Renal Stone Study  Result Date: 04/20/2022 CLINICAL DATA:  Flank pain, kidney stone suspected EXAM: CT ABDOMEN AND PELVIS WITHOUT CONTRAST TECHNIQUE: Multidetector CT imaging of the abdomen and pelvis was performed following the standard protocol without IV contrast. RADIATION DOSE REDUCTION: This exam was performed according to the departmental dose-optimization program which includes automated exposure control, adjustment  of the mA and/or kV according to patient size and/or use of iterative reconstruction technique. COMPARISON:  None Available. FINDINGS: Lower chest: 4 mm pulmonary nodule within the right lower lobe. Tiny hiatal hernia. Otherwise no acute abnormality. Hepatobiliary: No focal liver abnormality. No gallstones, gallbladder wall thickening, or pericholecystic fluid. No biliary dilatation. Pancreas: Diffusely atrophic. No focal lesion. Otherwise normal pancreatic contour. No surrounding inflammatory changes. No main pancreatic ductal dilatation. Spleen: Punctate calcifications likely sequelae of prior granulomatous disease. Otherwise normal in size without focal abnormality. Adrenals/Urinary Tract: No adrenal nodule bilaterally. Bilateral renal cortical scarring. Punctate right nephrolithiasis. No left nephrolithiasis. There is a fluid dense lesion within the right kidney that statistically likely represents a simple renal cyst. Simple renal cysts, in the absence of clinically indicated signs/symptoms, require no independent follow-up. No hydronephrosis. No definite contour-deforming renal mass. No ureterolithiasis or hydroureter. Decompressed urinary bladder with urinary bladder wall haziness and perivascular fat stranding. Stomach/Bowel: Stomach is within normal limits.  No evidence of bowel wall thickening or dilatation. Scattered colonic diverticulosis. 6-7 cm stool ball within the rectum. The appendix appears normal. Vascular/Lymphatic: No abdominal aorta or iliac aneurysm. Mild atherosclerotic plaque of the aorta and its branches. No abdominal, pelvic, or inguinal lymphadenopathy. Reproductive: Prostate is unremarkable. Other: No intraperitoneal free fluid. No intraperitoneal free gas. No organized fluid collection. Musculoskeletal: No abdominal wall hernia or abnormality. No suspicious lytic or blastic osseous lesions. No acute displaced fracture. Intervertebral disc space vacuum phenomenon. IMPRESSION: 1. Decompressed urinary bladder with urinary bladder wall haziness and trace perivascular fat stranding. Consider correlation with urinalysis to exclude infection. 2. Nonobstructive right punctate nephrolithiasis. 3. Colonic diverticulosis with no acute diverticulitis. 4. A 6-7 cm stool ball within the rectum. 5. A 4 mm right lower lobe pulmonary nodule. No follow-up needed if patient is low-risk.This recommendation follows the consensus statement: Guidelines for Management of Incidental Pulmonary Nodules Detected on CT Images: From the Fleischner Society 2017; Radiology 2017; 284:228-243. 6.  Aortic Atherosclerosis (ICD10-I70.0). Electronically Signed   By: Iven Finn M.D.   On: 04/20/2022 19:20    Procedures .Critical Care  Performed by: Sherwood Gambler, MD Authorized by: Sherwood Gambler, MD   Critical care provider statement:    Critical care time (minutes):  40   Critical care time was exclusive of:  Separately billable procedures and treating other patients   Critical care was necessary to treat or prevent imminent or life-threatening deterioration of the following conditions:  Sepsis   Critical care was time spent personally by me on the following activities:  Development of treatment plan with patient or surrogate, discussions with consultants, evaluation  of patient's response to treatment, examination of patient, ordering and review of laboratory studies, ordering and review of radiographic studies, ordering and performing treatments and interventions, pulse oximetry, re-evaluation of patient's condition and review of old charts     Medications Ordered in ED Medications  acetaminophen (TYLENOL) tablet 1,000 mg (has no administration in time range)  lactated ringers bolus 1,000 mL (has no administration in time range)    ED Course/ Medical Decision Making/ A&P Clinical Course as of 04/20/22 2303  Fri Apr 20, 2022  1908 Patient's lactate has returned at 4.5. While he is initially well appearing, will treat as sepsis and give IV rocephin for presumed urinary source. WBC is normal at 9.1 but differential shows increased neutrophils.  [SG]  2028 Patient is asleep and his most recent blood pressures are now in the 80s.  Map is right at 64.  My suspicion is his blood pressures were soft before and now are little lower because he sleeping.  However we will give the full 30 cc/KG of IV fluids and I have updated the hospitalist, Dr. Bridgett Larsson. [SG]    Clinical Course User Index [SG] Sherwood Gambler, MD                           Medical Decision Making Amount and/or Complexity of Data Reviewed Labs: ordered. Radiology: ordered. ECG/medicine tests: ordered.  Risk OTC drugs. Decision regarding hospitalization.   Patient is well-appearing but he does have a significant lactic acidosis.  As above his pressures transiently dropped while he was asleep but now are back up.  He was given IV Rocephin as above.  Other labs are unremarkable.  CT imaging viewed by myself and there is no obvious obstructive ureteral stone or other emergent condition.  Discussed with Dr. Bridgett Larsson for admission.        Final Clinical Impression(s) / ED Diagnoses Final diagnoses:  Severe sepsis ([facility])  Acute urinary tract infection    Rx / DC Orders ED Discharge Orders      None         Sherwood Gambler, MD 04/20/22 1007    Sherwood Gambler, MD 04/20/22 2304

## 2022-04-20 NOTE — Assessment & Plan Note (Signed)
Stable. In NSR. Continue cardizem CD 120 mg, eliquis 5 mg bid.

## 2022-04-20 NOTE — Assessment & Plan Note (Signed)
Stable. Pt is diet controlled. Add SSI.

## 2022-04-20 NOTE — Subjective & Objective (Signed)
CC: fever, back pain HPI: 76 year old male history of diet-controlled type 2 diabetes, a flutter on Eliquis and Cardizem, hypertension, hyperlipidemia, recent COVID-19 infection about 6 days ago presents to the ER today with a 1 day history of fever and back pain.  Patient was diagnosed with COVID about 6 days ago.  He took a home test.  He called his primary care physician.  He has been on molnupiravir for the last 4 days.  His last dose is tonight.  He developed severe back pain along with dysuria last night.  He started having fevers today.  On arrival to the ER, initial temperature was 102 and increased to 104.5.  Heart rate 86.  Blood pressure 114/62.  Labs showed a white count of 9.1, hemoglobin 15.6, platelets of 141  Initial lactic acid was elevated at 4.5.  Urinalysis showed large blood, 21-50 RBCs, nitrate positive, leukocyte is positive, bacteria many.  Sodium 138, potassium 3.5 chloride 102, bicarb 19, BUN 25, creatinine 1.23  Blood and urine cultures were obtained.  CT urogram was negative for pyelonephritis.  He has small nonobstructive kidney stone.  There was a 6 cm stool ball in the rectum.  Patient states he has not had a bowel movement in a least 1 day.  To the patient's sepsis, UTI, [facility] hospitalist contacted for admission.

## 2022-04-20 NOTE — Assessment & Plan Note (Signed)
Stable. Continue pravachol 80 mg qd.

## 2022-04-20 NOTE — Sepsis Progress Note (Signed)
Elink following Code Sepsis. 

## 2022-04-20 NOTE — ED Notes (Signed)
MD Bridgett Larsson notified BP has improved, pt awake and alert, currently eating food wife brought

## 2022-04-21 DIAGNOSIS — I483 Typical atrial flutter: Secondary | ICD-10-CM | POA: Diagnosis present

## 2022-04-21 DIAGNOSIS — A419 Sepsis, unspecified organism: Secondary | ICD-10-CM | POA: Diagnosis present

## 2022-04-21 DIAGNOSIS — Z79890 Hormone replacement therapy (postmenopausal): Secondary | ICD-10-CM | POA: Diagnosis not present

## 2022-04-21 DIAGNOSIS — E1165 Type 2 diabetes mellitus with hyperglycemia: Secondary | ICD-10-CM | POA: Diagnosis present

## 2022-04-21 DIAGNOSIS — N390 Urinary tract infection, site not specified: Secondary | ICD-10-CM | POA: Diagnosis not present

## 2022-04-21 DIAGNOSIS — I10 Essential (primary) hypertension: Secondary | ICD-10-CM | POA: Diagnosis present

## 2022-04-21 DIAGNOSIS — E038 Other specified hypothyroidism: Secondary | ICD-10-CM

## 2022-04-21 DIAGNOSIS — E7800 Pure hypercholesterolemia, unspecified: Secondary | ICD-10-CM | POA: Diagnosis present

## 2022-04-21 DIAGNOSIS — E872 Acidosis, unspecified: Secondary | ICD-10-CM | POA: Diagnosis present

## 2022-04-21 DIAGNOSIS — R6520 Severe sepsis without septic shock: Secondary | ICD-10-CM | POA: Diagnosis present

## 2022-04-21 DIAGNOSIS — E039 Hypothyroidism, unspecified: Secondary | ICD-10-CM | POA: Diagnosis present

## 2022-04-21 DIAGNOSIS — Z79899 Other long term (current) drug therapy: Secondary | ICD-10-CM | POA: Diagnosis not present

## 2022-04-21 DIAGNOSIS — Z8616 Personal history of COVID-19: Secondary | ICD-10-CM | POA: Diagnosis not present

## 2022-04-21 DIAGNOSIS — U071 COVID-19: Secondary | ICD-10-CM | POA: Diagnosis not present

## 2022-04-21 DIAGNOSIS — Z7901 Long term (current) use of anticoagulants: Secondary | ICD-10-CM | POA: Diagnosis not present

## 2022-04-21 DIAGNOSIS — Z87891 Personal history of nicotine dependence: Secondary | ICD-10-CM | POA: Diagnosis not present

## 2022-04-21 DIAGNOSIS — N3000 Acute cystitis without hematuria: Secondary | ICD-10-CM | POA: Diagnosis present

## 2022-04-21 DIAGNOSIS — R9431 Abnormal electrocardiogram [ECG] [EKG]: Secondary | ICD-10-CM | POA: Diagnosis not present

## 2022-04-21 DIAGNOSIS — Z809 Family history of malignant neoplasm, unspecified: Secondary | ICD-10-CM | POA: Diagnosis not present

## 2022-04-21 LAB — CBC WITH DIFFERENTIAL/PLATELET
Abs Immature Granulocytes: 0.3 10*3/uL — ABNORMAL HIGH (ref 0.00–0.07)
Basophils Absolute: 0 10*3/uL (ref 0.0–0.1)
Basophils Relative: 0 %
Eosinophils Absolute: 0 10*3/uL (ref 0.0–0.5)
Eosinophils Relative: 0 %
HCT: 42.8 % (ref 39.0–52.0)
Hemoglobin: 14.2 g/dL (ref 13.0–17.0)
Immature Granulocytes: 2 %
Lymphocytes Relative: 8 %
Lymphs Abs: 1.2 10*3/uL (ref 0.7–4.0)
MCH: 29.8 pg (ref 26.0–34.0)
MCHC: 33.2 g/dL (ref 30.0–36.0)
MCV: 89.9 fL (ref 80.0–100.0)
Monocytes Absolute: 0.6 10*3/uL (ref 0.1–1.0)
Monocytes Relative: 4 %
Neutro Abs: 12 10*3/uL — ABNORMAL HIGH (ref 1.7–7.7)
Neutrophils Relative %: 86 %
Platelets: 113 10*3/uL — ABNORMAL LOW (ref 150–400)
RBC: 4.76 MIL/uL (ref 4.22–5.81)
RDW: 13.5 % (ref 11.5–15.5)
Smear Review: DECREASED
WBC: 14.1 10*3/uL — ABNORMAL HIGH (ref 4.0–10.5)
nRBC: 0 % (ref 0.0–0.2)

## 2022-04-21 LAB — COMPREHENSIVE METABOLIC PANEL
ALT: 21 U/L (ref 0–44)
AST: 26 U/L (ref 15–41)
Albumin: 3.1 g/dL — ABNORMAL LOW (ref 3.5–5.0)
Alkaline Phosphatase: 34 U/L — ABNORMAL LOW (ref 38–126)
Anion gap: 9 (ref 5–15)
BUN: 22 mg/dL (ref 8–23)
CO2: 27 mmol/L (ref 22–32)
Calcium: 8.7 mg/dL — ABNORMAL LOW (ref 8.9–10.3)
Chloride: 101 mmol/L (ref 98–111)
Creatinine, Ser: 1.03 mg/dL (ref 0.61–1.24)
GFR, Estimated: >60 mL/min (ref >60)
Glucose, Bld: 168 mg/dL — ABNORMAL HIGH (ref 70–99)
Potassium: 3.6 mmol/L (ref 3.5–5.1)
Sodium: 137 mmol/L (ref 135–145)
Total Bilirubin: 0.6 mg/dL (ref 0.3–1.2)
Total Protein: 6.5 g/dL (ref 6.5–8.1)

## 2022-04-21 LAB — GLUCOSE, CAPILLARY
Glucose-Capillary: 139 mg/dL — ABNORMAL HIGH (ref 70–99)
Glucose-Capillary: 239 mg/dL — ABNORMAL HIGH (ref 70–99)
Glucose-Capillary: 151 mg/dL — ABNORMAL HIGH (ref 70–99)
Glucose-Capillary: 148 mg/dL — ABNORMAL HIGH (ref 70–99)

## 2022-04-21 LAB — LACTIC ACID, PLASMA
Lactic Acid, Venous: 1.5 mmol/L (ref 0.5–1.9)
Lactic Acid, Venous: 1.8 mmol/L (ref 0.5–1.9)

## 2022-04-21 LAB — MAGNESIUM: Magnesium: 1.9 mg/dL (ref 1.7–2.4)

## 2022-04-21 LAB — FERRITIN: Ferritin: 247 ng/mL (ref 24–336)

## 2022-04-21 LAB — D-DIMER, QUANTITATIVE: D-Dimer, Quant: 0.59 ug/mL-FEU — ABNORMAL HIGH (ref 0.00–0.50)

## 2022-04-21 LAB — LACTATE DEHYDROGENASE: LDH: 132 U/L (ref 98–192)

## 2022-04-21 LAB — C-REACTIVE PROTEIN: CRP: 21.6 mg/dL — ABNORMAL HIGH (ref <1.0)

## 2022-04-21 MED ORDER — SODIUM CHLORIDE 0.9 % IV SOLN: INTRAVENOUS | Status: DC

## 2022-04-21 NOTE — Plan of Care (Signed)

## 2022-04-21 NOTE — Progress Notes (Signed)
PROGRESS NOTE    Frederick Michael  GDJ:[id number] DOB: [birth year] DOA: 04/20/2022 PCP: Haywood Pao, MD     Brief Narrative:  76 year old WM PMHx DM Type II diet-controlled, A- flutter on Eliquis on Cardizem, HTN, HLD, positive COVID-19 infection about 6 days ago   Presents to the ER today with a 1 day history of fever and back pain.  Patient was diagnosed with COVID about 6 days ago.  He took a home test.  He called his primary care physician.  He has been on molnupiravir for the last 4 days.  His last dose is tonight.  He developed severe back pain along with dysuria last night.  He started having fevers today.   On arrival to the ER, initial temperature was 102 and increased to 104.5.  Heart rate 86.  Blood pressure 114/62.   Labs showed a white count of 9.1, hemoglobin 15.6, platelets of 141   Initial lactic acid was elevated at 4.5.   Urinalysis showed large blood, 21-50 RBCs, nitrate positive, leukocyte is positive, bacteria many.   Sodium 138, potassium 3.5 chloride 102, bicarb 19, BUN 25, creatinine 1.23   Blood and urine cultures were obtained.   CT urogram was negative for pyelonephritis.  He has small nonobstructive kidney stone.  There was a 6 cm stool ball in the rectum.  Patient states he has not had a bowel movement in a least 1 day.   Subjective: 6/10 Tmax overnight 40.3.   Assessment & Plan: Covid vaccination;   Principal Problem:   Acute cystitis without hematuria Active Problems:   Sepsis with acute organ dysfunction without septic shock ([employer])   COVID-19 virus infection   Typical atrial flutter (HCC)   HTN (hypertension)   DM2 (diabetes mellitus, type 2) (HCC)   HLD (hyperlipidemia)   Hypothyroidism   Prolonged QT interval  Acute cystitis without hematuria Admit to observation telemetry bed. Continue with IV rocephin 2 grams. Given his high fever of 104, would not be surprised if his blood cx were positive for GNR. Continue with IVF LR @ 100  ml/hr x 10 hours. Repeat CBC, CMP in AM.   Acute cystitis without hematuria is a Acute illness/condition that poses a threat to life or bodily function.     COVID-19 virus infection Pt on his last day of molnupiravir. Continue with isolation status as it has only been 6 days since his diagnosis. COVID-19 Labs  Recent Labs    04/21/22 0845  DDIMER 0.59*  FERRITIN 247  LDH 132  CRP 21.6*    Lab Results  Component Value Date   [place] NEGATIVE 01/27/2022      Sepsis with acute organ dysfunction without septic shock ([place]) Due to UTI. Evidenced by elevated lactic acid. Repeat CBC in AM.   Sepsis with acute organ dysfunction is a Acute illness/condition that poses a threat to life or bodily function.  Lactic acidosis - Trend lactic acid   Latest Reference Range & Units 04/20/22 18:30 04/20/22 20:22 04/21/22 08:45 04/21/22 11:38  Lactic Acid, Venous 0.5 - 1.9 mmol/L 4.5 (HH) 2.4 (HH) 1.5 1.8  ([place]): Data is critically high -6/10 normal saline 149m/hr   HLD (hyperlipidemia) Stable. Continue pravachol 80 mg qd.   DM type II uncontrolled with hyperglycemia -3/20 hemoglobin A1c =7/3  CBG (last 3)  Recent Labs    04/21/22 0729 04/21/22 1107 04/21/22 1655  GLUCAP 139* 239* 151*      HTN (hypertension) Stable. Will hold hctz while giving IVF.  Continue with cardizem cd 120 mg. Hold lisinopril for his UTI. He may have had early pyelo given his severe back pain but negative CT urogram.   Typical atrial flutter (HCC) Stable. In NSR. Continue cardizem CD 120 mg, eliquis 5 mg bid.   Prolonged QT interval New. QTc 516 msec. Will give IV mag 2 grams. Repeat EKG in AM.         Mobility Assessment (last 72 hours)     Mobility Assessment     Row Name 04/20/22 2149           Does patient have an order for bedrest or is patient medically unstable No - Continue assessment       What is the highest level of mobility based on the progressive mobility assessment? Level  6 (Walks independently in room and hall) - Balance while walking in room without assist - Complete                  Interdisciplinary Goals of Care Family Meeting   Date carried out: 04/21/2022  Location of the meeting:   Member's involved:   Durable Power of Tour manager:     Discussion: We discussed goals of care for [employer] .    Code status:   Disposition:   Time spent for the meeting:     Princes Finger, Geraldo Docker, MD  04/21/2022, 8:12 AM         DVT prophylaxis: Eliquis Code Status: Full Family Communication:  Status is: Inpatient    Dispo: The patient is from: Home              Anticipated d/c is to: Home              Anticipated d/c date is: 3 days              Patient currently is not medically stable to d/c.      Consultants:    Procedures/Significant Events:    I have personally reviewed and interpreted all radiology studies and my findings are as above.  VENTILATOR SETTINGS:    Cultures   Antimicrobials: Anti-infectives (From admission, onward)    Start     Dose/Rate Route Frequency Ordered Stop   04/20/22 1930  molnupiravir EUA (LAGEVRIO) capsule 800 mg  Status:  Discontinued       Note to Pharmacy: This is patient's last dose on previous treatment   4 capsule Oral Once 04/20/22 1920 04/20/22 1926   04/20/22 1915  cefTRIAXone (ROCEPHIN) 2 g in sodium chloride 0.9 % 100 mL IVPB        2 g 200 mL/hr over 30 Minutes Intravenous Every 24 hours 04/20/22 1909 04/27/22 1914         Devices    LINES / TUBES:      Continuous Infusions:  cefTRIAXone (ROCEPHIN)  IV 2 g (04/20/22 1925)     Objective: Vitals:   04/20/22 2143 04/21/22 0223 04/21/22 0441 04/21/22 0500  BP: (!) 90/55 113/60 (!) 123/52   Pulse: 60 (!) 50 (!) 48   Resp: '18 16 18   '$ Temp: 98.1 F (36.7 C) (!) 97.5 F (36.4 C) 97.7 F (36.5 C)   TempSrc: Oral Oral Oral   SpO2:  100% 100%   Weight:    92.6 kg  Height:         Intake/Output Summary (Last 24 hours) at 04/21/2022 1027 Last data filed at 04/21/2022 0500 Gross per 24  hour  Intake 1491.36 ml  Output 250 ml  Net 1241.36 ml   Filed Weights   04/20/22 1750 04/21/22 0500  Weight: 92.5 kg 92.6 kg    Examination:  General: A/O x4 No acute respiratory distress Eyes: negative scleral hemorrhage, negative anisocoria, negative icterus ENT: Negative Runny nose, negative gingival bleeding, Neck:  Negative scars, masses, torticollis, lymphadenopathy, JVD Lungs: Clear to auscultation bilaterally without wheezes or crackles Cardiovascular: Regular rate and rhythm without murmur gallop or rub normal S1 and S2 Abdomen: negative abdominal pain, nondistended, positive soft, bowel sounds, no rebound, no ascites, no appreciable mass Extremities: No significant cyanosis, clubbing, or edema bilateral lower extremities Skin: Negative rashes, lesions, ulcers Psychiatric:  Negative depression, negative anxiety, negative fatigue, negative mania  Central nervous system:  Cranial nerves II through XII intact, tongue/uvula midline, all extremities muscle strength 5/5, sensation intact throughout, negative dysarthria, negative expressive aphasia, negative receptive aphasia.  .     Data Reviewed: Care during the described time interval was provided by me .  I have reviewed this patient's available data, including medical history, events of note, physical examination, and all test results as part of my evaluation.  CBC: Recent Labs  Lab 04/20/22 1830 04/21/22 0436  WBC 9.1 14.1*  NEUTROABS 8.3* 12.0*  HGB 15.6 14.2  HCT 46.2 42.8  MCV 89.4 89.9  PLT 141* 542*   Basic Metabolic Panel: Recent Labs  Lab 04/20/22 1830 04/21/22 0436  NA 138 137  K 3.5 3.6  CL 102 101  CO2 19* 27  GLUCOSE 191* 168*  BUN 23 22  CREATININE 1.23 1.03  CALCIUM 9.1 8.7*  MG  --  1.9   GFR: Estimated Creatinine Clearance: 74.1 mL/min (by C-G formula based on SCr of 1.03  mg/dL). Liver Function Tests: Recent Labs  Lab 04/20/22 1830 04/21/22 0436  AST 31 26  ALT 26 21  ALKPHOS 51 34*  BILITOT 1.5* 0.6  PROT 7.4 6.5  ALBUMIN 3.8 3.1*   No results for input(s): "LIPASE", "AMYLASE" in the last 168 hours. No results for input(s): "AMMONIA" in the last 168 hours. Coagulation Profile: Recent Labs  Lab 04/20/22 1840  INR 1.5*   Cardiac Enzymes: No results for input(s): "CKTOTAL", "CKMB", "CKMBINDEX", "TROPONINI" in the last 168 hours. BNP (last 3 results) No results for input(s): "PROBNP" in the last 8760 hours. HbA1C: No results for input(s): "HGBA1C" in the last 72 hours. CBG: Recent Labs  Lab 04/20/22 2147 04/20/22 2330 04/21/22 0729  GLUCAP 225* 236* 139*   Lipid Profile: No results for input(s): "CHOL", "HDL", "LDLCALC", "TRIG", "CHOLHDL", "LDLDIRECT" in the last 72 hours. Thyroid Function Tests: No results for input(s): "TSH", "T4TOTAL", "FREET4", "T3FREE", "THYROIDAB" in the last 72 hours. Anemia Panel: No results for input(s): "VITAMINB12", "FOLATE", "FERRITIN", "TIBC", "IRON", "RETICCTPCT" in the last 72 hours. Sepsis Labs: Recent Labs  Lab 04/20/22 1830 04/20/22 2022  LATICACIDVEN 4.5* 2.4*    No results found for this or any previous visit (from the past 240 hour(s)).       Radiology Studies: DG Chest Port 1 View  Result Date: 04/20/2022 CLINICAL DATA:  Questionable sepsis - evaluate for abnormality EXAM: PORTABLE CHEST 1 VIEW COMPARISON:  Chest x-ray 01/27/2022 FINDINGS: The heart and mediastinal contours are unchanged. No focal consolidation. No pulmonary edema. No pleural effusion. No pneumothorax. No acute osseous abnormality. IMPRESSION: No active disease. Electronically Signed   By: Iven Finn M.D.   On: 04/20/2022 19:22   CT Renal Stone Study  Result  Date: 04/20/2022 CLINICAL DATA:  Flank pain, kidney stone suspected EXAM: CT ABDOMEN AND PELVIS WITHOUT CONTRAST TECHNIQUE: Multidetector CT imaging of the  abdomen and pelvis was performed following the standard protocol without IV contrast. RADIATION DOSE REDUCTION: This exam was performed according to the departmental dose-optimization program which includes automated exposure control, adjustment of the mA and/or kV according to patient size and/or use of iterative reconstruction technique. COMPARISON:  None Available. FINDINGS: Lower chest: 4 mm pulmonary nodule within the right lower lobe. Tiny hiatal hernia. Otherwise no acute abnormality. Hepatobiliary: No focal liver abnormality. No gallstones, gallbladder wall thickening, or pericholecystic fluid. No biliary dilatation. Pancreas: Diffusely atrophic. No focal lesion. Otherwise normal pancreatic contour. No surrounding inflammatory changes. No main pancreatic ductal dilatation. Spleen: Punctate calcifications likely sequelae of prior granulomatous disease. Otherwise normal in size without focal abnormality. Adrenals/Urinary Tract: No adrenal nodule bilaterally. Bilateral renal cortical scarring. Punctate right nephrolithiasis. No left nephrolithiasis. There is a fluid dense lesion within the right kidney that statistically likely represents a simple renal cyst. Simple renal cysts, in the absence of clinically indicated signs/symptoms, require no independent follow-up. No hydronephrosis. No definite contour-deforming renal mass. No ureterolithiasis or hydroureter. Decompressed urinary bladder with urinary bladder wall haziness and perivascular fat stranding. Stomach/Bowel: Stomach is within normal limits. No evidence of bowel wall thickening or dilatation. Scattered colonic diverticulosis. 6-7 cm stool ball within the rectum. The appendix appears normal. Vascular/Lymphatic: No abdominal aorta or iliac aneurysm. Mild atherosclerotic plaque of the aorta and its branches. No abdominal, pelvic, or inguinal lymphadenopathy. Reproductive: Prostate is unremarkable. Other: No intraperitoneal free fluid. No intraperitoneal  free gas. No organized fluid collection. Musculoskeletal: No abdominal wall hernia or abnormality. No suspicious lytic or blastic osseous lesions. No acute displaced fracture. Intervertebral disc space vacuum phenomenon. IMPRESSION: 1. Decompressed urinary bladder with urinary bladder wall haziness and trace perivascular fat stranding. Consider correlation with urinalysis to exclude infection. 2. Nonobstructive right punctate nephrolithiasis. 3. Colonic diverticulosis with no acute diverticulitis. 4. A 6-7 cm stool ball within the rectum. 5. A 4 mm right lower lobe pulmonary nodule. No follow-up needed if patient is low-risk.This recommendation follows the consensus statement: Guidelines for Management of Incidental Pulmonary Nodules Detected on CT Images: From the Fleischner Society 2017; Radiology 2017; 284:228-243. 6.  Aortic Atherosclerosis (ICD10-I70.0). Electronically Signed   By: Iven Finn M.D.   On: 04/20/2022 19:20        Scheduled Meds:  apixaban  5 mg Oral BID   diltiazem  120 mg Oral Daily   insulin aspart  0-5 Units Subcutaneous QHS   insulin aspart  0-9 Units Subcutaneous TID WC   levothyroxine  150 mcg Oral Daily   pravastatin  80 mg Oral QHS   Continuous Infusions:  cefTRIAXone (ROCEPHIN)  IV 2 g (04/20/22 1925)     LOS: 0 days    Time spent:40 min    Lan Mcneill, Geraldo Docker, MD [facility]   If 7PM-7AM, please contact night-coverage 04/21/2022, 8:12 AM

## 2022-04-22 LAB — CBC WITH DIFFERENTIAL/PLATELET
Abs Immature Granulocytes: 0.06 10*3/uL (ref 0.00–0.07)
Basophils Absolute: 0 10*3/uL (ref 0.0–0.1)
Basophils Relative: 0 %
Eosinophils Absolute: 0.1 10*3/uL (ref 0.0–0.5)
Eosinophils Relative: 1 %
HCT: 37.4 % — ABNORMAL LOW (ref 39.0–52.0)
Hemoglobin: 12.2 g/dL — ABNORMAL LOW (ref 13.0–17.0)
Immature Granulocytes: 1 %
Lymphocytes Relative: 12 %
Lymphs Abs: 1 10*3/uL (ref 0.7–4.0)
MCH: 29.9 pg (ref 26.0–34.0)
MCHC: 32.6 g/dL (ref 30.0–36.0)
MCV: 91.7 fL (ref 80.0–100.0)
Monocytes Absolute: 1.1 10*3/uL — ABNORMAL HIGH (ref 0.1–1.0)
Monocytes Relative: 13 %
Neutro Abs: 6.3 10*3/uL (ref 1.7–7.7)
Neutrophils Relative %: 73 %
Platelets: 113 10*3/uL — ABNORMAL LOW (ref 150–400)
RBC: 4.08 MIL/uL — ABNORMAL LOW (ref 4.22–5.81)
RDW: 13.8 % (ref 11.5–15.5)
WBC: 8.6 10*3/uL (ref 4.0–10.5)
nRBC: 0 % (ref 0.0–0.2)

## 2022-04-22 LAB — GLUCOSE, CAPILLARY
Glucose-Capillary: 138 mg/dL — ABNORMAL HIGH (ref 70–99)
Glucose-Capillary: 130 mg/dL — ABNORMAL HIGH (ref 70–99)
Glucose-Capillary: 146 mg/dL — ABNORMAL HIGH (ref 70–99)
Glucose-Capillary: 135 mg/dL — ABNORMAL HIGH (ref 70–99)

## 2022-04-22 LAB — COMPREHENSIVE METABOLIC PANEL
ALT: 17 U/L (ref 0–44)
AST: 19 U/L (ref 15–41)
Albumin: 2.7 g/dL — ABNORMAL LOW (ref 3.5–5.0)
Alkaline Phosphatase: 34 U/L — ABNORMAL LOW (ref 38–126)
Anion gap: 6 (ref 5–15)
BUN: 17 mg/dL (ref 8–23)
CO2: 24 mmol/L (ref 22–32)
Calcium: 8.1 mg/dL — ABNORMAL LOW (ref 8.9–10.3)
Chloride: 106 mmol/L (ref 98–111)
Creatinine, Ser: 0.81 mg/dL (ref 0.61–1.24)
GFR, Estimated: >60 mL/min (ref >60)
Glucose, Bld: 137 mg/dL — ABNORMAL HIGH (ref 70–99)
Potassium: 3.6 mmol/L (ref 3.5–5.1)
Sodium: 136 mmol/L (ref 135–145)
Total Bilirubin: 0.7 mg/dL (ref 0.3–1.2)
Total Protein: 5.7 g/dL — ABNORMAL LOW (ref 6.5–8.1)

## 2022-04-22 LAB — PHOSPHORUS: Phosphorus: 2.1 mg/dL — ABNORMAL LOW (ref 2.5–4.6)

## 2022-04-22 LAB — FERRITIN: Ferritin: 204 ng/mL (ref 24–336)

## 2022-04-22 LAB — C-REACTIVE PROTEIN: CRP: 21.4 mg/dL — ABNORMAL HIGH (ref <1.0)

## 2022-04-22 LAB — MAGNESIUM: Magnesium: 2.2 mg/dL (ref 1.7–2.4)

## 2022-04-22 LAB — D-DIMER, QUANTITATIVE: D-Dimer, Quant: 0.56 ug/mL-FEU — ABNORMAL HIGH (ref 0.00–0.50)

## 2022-04-22 LAB — LACTATE DEHYDROGENASE: LDH: 124 U/L (ref 98–192)

## 2022-04-22 NOTE — Plan of Care (Signed)
  Problem: Education: Goal: Knowledge of General Education information will improve Description: Including pain rating scale, medication(s)/side effects and non-pharmacologic comfort measures Outcome: Progressing   Problem: Activity: Goal: Risk for activity intolerance will decrease Outcome: Progressing    Problem: Fluid Volume: Goal: Ability to maintain a balanced intake and output will improve Outcome: Progressing

## 2022-04-22 NOTE — Plan of Care (Signed)

## 2022-04-22 NOTE — Progress Notes (Signed)
PROGRESS NOTE    Frederick Michael  VOJ:[id number] DOB: 15-Feb-1946 DOA: 04/20/2022 PCP: Haywood Pao, MD     Brief Narrative:  76 year old WM PMHx DM Type II diet-controlled, A- flutter on Eliquis on Cardizem, HTN, HLD, positive COVID-19 infection about 6 days ago   Presents to the ER today with a 1 day history of fever and back pain.  Patient was diagnosed with COVID about 6 days ago.  He took a home test.  He called his primary care physician.  He has been on molnupiravir for the last 4 days.  His last dose is tonight.  He developed severe back pain along with dysuria last night.  He started having fevers today.   On arrival to the ER, initial temperature was 102 and increased to 104.5.  Heart rate 86.  Blood pressure 114/62.   Labs showed a white count of 9.1, hemoglobin 15.6, platelets of 141   Initial lactic acid was elevated at 4.5.   Urinalysis showed large blood, 21-50 RBCs, nitrate positive, leukocyte is positive, bacteria many.   Sodium 138, potassium 3.5 chloride 102, bicarb 19, BUN 25, creatinine 1.23   Blood and urine cultures were obtained.   CT urogram was negative for pyelonephritis.  He has small nonobstructive kidney stone.  There was a 6 cm stool ball in the rectum.  Patient states he has not had a bowel movement in a least 1 day.   Subjective: 6/11 afebrile overnight A/O x4, states feels much improved      Assessment & Plan: Covid vaccination;   Principal Problem:   Acute cystitis without hematuria Active Problems:   Sepsis with acute organ dysfunction without septic shock ([place])   COVID-19 virus infection   Typical atrial flutter (HCC)   HTN (hypertension)   DM2 (diabetes mellitus, type 2) (HCC)   HLD (hyperlipidemia)   Hypothyroidism   Prolonged QT interval  Acute cystitis without hematuria/positive GNR Admit to observation telemetry bed. Continue with IV rocephin 2 grams. Given his high fever of 104, would not be surprised if his blood cx  were positive for GNR. Continue with IVF LR @ 100 ml/hr x 10 hours. Repeat CBC, CMP in AM.   Acute cystitis without hematuria is a Acute illness/condition that poses a threat to life or bodily function.     COVID-19 virus infection Pt on his last day of molnupiravir. Continue with isolation status as it has only been 6 days since his diagnosis. COVID-19 Labs  Recent Labs    04/21/22 0845 04/22/22 0401  DDIMER 0.59* 0.56*  FERRITIN 247 204  LDH 132 124  CRP 21.6*  --      Lab Results  Component Value Date   [place] NEGATIVE 01/27/2022      Sepsis with acute organ dysfunction without septic shock ([place]) Due to UTI. Evidenced by elevated lactic acid. Repeat CBC in AM.   Sepsis with acute organ dysfunction is a Acute illness/condition that poses a threat to life or bodily function.  Lactic acidosis - Trend lactic acid   Latest Reference Range & Units 04/20/22 18:30 04/20/22 20:22 04/21/22 08:45 04/21/22 11:38  Lactic Acid, Venous 0.5 - 1.9 mmol/L 4.5 (HH) 2.4 (HH) 1.5 1.8  ([place]): Data is critically high -6/10 normal saline 172m/hr   HLD (hyperlipidemia) Stable. Continue pravachol 80 mg qd.   DM type II uncontrolled with hyperglycemia -3/20 hemoglobin A1c =7/3  CBG (last 3)  Recent Labs    04/21/22 1956 04/22/22 0818 04/22/22 1239  GLUCAP 148* 138* 130*       HTN (hypertension) Stable. Will hold hctz while giving IVF. Continue with cardizem cd 120 mg. Hold lisinopril for his UTI. He may have had early pyelo given his severe back pain but negative CT urogram.   Typical atrial flutter (HCC) Stable. In NSR. Continue cardizem CD 120 mg, eliquis 5 mg bid.   Prolonged QT interval New. QTc 516 msec. Will give IV mag 2 grams. Repeat EKG in AM.  Hypocalcemia -6/11 corrected calcium= 9.1 therefore no requirement replaced at this time, continue to monitor         Mobility Assessment (last 72 hours)     Mobility Assessment     Row Name 04/22/22 0842  04/21/22 2111 04/20/22 2149       Does patient have an order for bedrest or is patient medically unstable No - Continue assessment No - Continue assessment No - Continue assessment     What is the highest level of mobility based on the progressive mobility assessment? Level 5 (Walks with assist in room/hall) - Balance while stepping forward/back and can walk in room with assist - Complete Level 6 (Walks independently in room and hall) - Balance while walking in room without assist - Complete Level 6 (Walks independently in room and hall) - Balance while walking in room without assist - Complete                Interdisciplinary Goals of Care Family Meeting   Date carried out: 04/22/2022  Location of the meeting:   Member's involved:   Durable Power of Tour manager:     Discussion: We discussed goals of care for [employer] .    Code status:   Disposition:   Time spent for the meeting:     Allie Bossier, MD  04/22/2022, 2:47 PM         DVT prophylaxis: Eliquis Code Status: Full Family Communication:  Status is: Inpatient    Dispo: The patient is from: Home              Anticipated d/c is to: Home              Anticipated d/c date is: 3 days              Patient currently is not medically stable to d/c.      Consultants:    Procedures/Significant Events:    I have personally reviewed and interpreted all radiology studies and my findings are as above.  VENTILATOR SETTINGS:    Cultures 6/9 positive GNR 6/9 blood RIGHT AC NGTD 6/9 blood LEFT AC NGTD   Antimicrobials: Anti-infectives (From admission, onward)    Start     Dose/Rate Route Frequency Ordered Stop   04/20/22 1930  molnupiravir EUA (LAGEVRIO) capsule 800 mg  Status:  Discontinued       Note to Pharmacy: This is patient's last dose on previous treatment   4 capsule Oral Once 04/20/22 1920 04/20/22 1926   04/20/22 1915  cefTRIAXone (ROCEPHIN) 2 g  in sodium chloride 0.9 % 100 mL IVPB        2 g 200 mL/hr over 30 Minutes Intravenous Every 24 hours 04/20/22 1909 04/27/22 1914         Devices    LINES / TUBES:      Continuous Infusions:  sodium chloride 100 mL/hr at 04/21/22 2151   cefTRIAXone (ROCEPHIN)  IV 2 g (04/21/22  1850)     Objective: Vitals:   04/21/22 0500 04/21/22 1157 04/21/22 1231 04/21/22 2128  BP:  (!) 114/57 (!) 107/52 (!) 123/59  Pulse:  (!) 51 (!) 55 (!) 52  Resp:  '18 18 17  '$ Temp:  98.5 F (36.9 C) 98.5 F (36.9 C) 98.4 F (36.9 C)  TempSrc:  Oral Oral Oral  SpO2:  99% 98% 97%  Weight: 92.6 kg     Height:        Intake/Output Summary (Last 24 hours) at 04/22/2022 1447 Last data filed at 04/22/2022 1400 Gross per 24 hour  Intake 2278.27 ml  Output 1900 ml  Net 378.27 ml    Filed Weights   04/20/22 1750 04/21/22 0500  Weight: 92.5 kg 92.6 kg    Examination:  General: A/O x4 No acute respiratory distress Eyes: negative scleral hemorrhage, negative anisocoria, negative icterus ENT: Negative Runny nose, negative gingival bleeding, Neck:  Negative scars, masses, torticollis, lymphadenopathy, JVD Lungs: Clear to auscultation bilaterally without wheezes or crackles Cardiovascular: Regular rate and rhythm without murmur gallop or rub normal S1 and S2 Abdomen: negative abdominal pain, nondistended, positive soft, bowel sounds, no rebound, no ascites, no appreciable mass Extremities: No significant cyanosis, clubbing, or edema bilateral lower extremities Skin: Negative rashes, lesions, ulcers Psychiatric:  Negative depression, negative anxiety, negative fatigue, negative mania  Central nervous system:  Cranial nerves II through XII intact, tongue/uvula midline, all extremities muscle strength 5/5, sensation intact throughout, negative dysarthria, negative expressive aphasia, negative receptive aphasia.  .     Data Reviewed: Care during the described time interval was provided by me .  I  have reviewed this patient's available data, including medical history, events of note, physical examination, and all test results as part of my evaluation.  CBC: Recent Labs  Lab 04/20/22 1830 04/21/22 0436 04/22/22 0401  WBC 9.1 14.1* 8.6  NEUTROABS 8.3* 12.0* 6.3  HGB 15.6 14.2 12.2*  HCT 46.2 42.8 37.4*  MCV 89.4 89.9 91.7  PLT 141* 113* 113*    Basic Metabolic Panel: Recent Labs  Lab 04/20/22 1830 04/21/22 0436 04/22/22 0401  NA 138 137 136  K 3.5 3.6 3.6  CL 102 101 106  CO2 19* 27 24  GLUCOSE 191* 168* 137*  BUN '23 22 17  '$ CREATININE 1.23 1.03 0.81  CALCIUM 9.1 8.7* 8.1*  MG  --  1.9 2.2  PHOS  --   --  2.1*    GFR: Estimated Creatinine Clearance: 94.2 mL/min (by C-G formula based on SCr of 0.81 mg/dL). Liver Function Tests: Recent Labs  Lab 04/20/22 1830 04/21/22 0436 04/22/22 0401  AST '31 26 19  '$ ALT '26 21 17  '$ ALKPHOS 51 34* 34*  BILITOT 1.5* 0.6 0.7  PROT 7.4 6.5 5.7*  ALBUMIN 3.8 3.1* 2.7*    No results for input(s): "LIPASE", "AMYLASE" in the last 168 hours. No results for input(s): "AMMONIA" in the last 168 hours. Coagulation Profile: Recent Labs  Lab 04/20/22 1840  INR 1.5*    Cardiac Enzymes: No results for input(s): "CKTOTAL", "CKMB", "CKMBINDEX", "TROPONINI" in the last 168 hours. BNP (last 3 results) No results for input(s): "PROBNP" in the last 8760 hours. HbA1C: No results for input(s): "HGBA1C" in the last 72 hours. CBG: Recent Labs  Lab 04/21/22 1107 04/21/22 1655 04/21/22 1956 04/22/22 0818 04/22/22 1239  GLUCAP 239* 151* 148* 138* 130*    Lipid Profile: No results for input(s): "CHOL", "HDL", "LDLCALC", "TRIG", "CHOLHDL", "LDLDIRECT" in the last 72 hours. Thyroid  Function Tests: No results for input(s): "TSH", "T4TOTAL", "FREET4", "T3FREE", "THYROIDAB" in the last 72 hours. Anemia Panel: Recent Labs    04/21/22 0845 04/22/22 0401  FERRITIN 247 204   Sepsis Labs: Recent Labs  Lab 04/20/22 1830  04/20/22 2022 04/21/22 0845 04/21/22 1138  LATICACIDVEN 4.5* 2.4* 1.5 1.8     Recent Results (from the past 240 hour(s))  Blood Culture (routine x 2)     Status: None (Preliminary result)   Collection Time: 04/20/22  6:30 PM   Specimen: BLOOD  Result Value Ref Range Status   Specimen Description   Final    BLOOD RIGHT ANTECUBITAL Performed at [facility], [place] [address] [place], [place] [address]    Special Requests   Final    BOTTLES DRAWN AEROBIC AND ANAEROBIC Blood Culture adequate volume Performed at [facility] [address] [place], [place] [address]    Culture   Final    NO GROWTH < 12 HOURS Performed at [facility] [address], [place], [place] [address]    Report Status PENDING  Incomplete  Blood Culture (routine x 2)     Status: None (Preliminary result)   Collection Time: 04/20/22  6:35 PM   Specimen: BLOOD  Result Value Ref Range Status   Specimen Description   Final    BLOOD LEFT ANTECUBITAL Performed at [facility] [address], [place], [place] [address]    Special Requests   Final    BOTTLES DRAWN AEROBIC ONLY Blood Culture results may not be optimal due to an excessive volume of blood received in culture bottles Performed at [facility] [address] [place], [place] [address]    Culture   Final    NO GROWTH < 12 HOURS Performed at [facility] [address] [place], [place] [address]    Report Status PENDING  Incomplete  Urine Culture     Status: Abnormal (Preliminary result)   Collection Time: 04/20/22  6:43 PM   Specimen: In/Out Cath Urine  Result Value Ref Range Status   Specimen Description   Final    IN/OUT CATH URINE Performed at [facility] [address], [place],  [address]    Special Requests   Final    NONE Performed at [facility], [place] [address] [place],  [address]     Culture >=100,000 COLONIES/mL GRAM NEGATIVE RODS (A)  Final   Report Status PENDING  Incomplete         Radiology Studies: DG Chest Port 1 View  Result Date: 04/20/2022 CLINICAL DATA:  Questionable sepsis - evaluate for abnormality EXAM: PORTABLE CHEST 1 VIEW COMPARISON:  Chest x-ray 01/27/2022 FINDINGS: The heart and mediastinal contours are unchanged. No focal consolidation. No pulmonary edema. No pleural effusion. No pneumothorax. No acute osseous abnormality. IMPRESSION: No active disease. Electronically Signed   By: Iven Finn M.D.   On: 04/20/2022 19:22   CT Renal Stone Study  Result Date: 04/20/2022 CLINICAL DATA:  Flank pain, kidney stone suspected EXAM: CT ABDOMEN AND PELVIS WITHOUT CONTRAST TECHNIQUE: Multidetector CT imaging of the abdomen and pelvis was performed following the standard protocol without IV contrast. RADIATION DOSE REDUCTION: This exam was performed according to the departmental dose-optimization program which includes automated exposure control, adjustment of the mA and/or kV according to patient size and/or use of iterative reconstruction technique. COMPARISON:  None Available. FINDINGS: Lower chest: 4 mm pulmonary  nodule within the right lower lobe. Tiny hiatal hernia. Otherwise no acute abnormality. Hepatobiliary: No focal liver abnormality. No gallstones, gallbladder wall thickening, or pericholecystic fluid. No biliary dilatation. Pancreas: Diffusely atrophic. No focal lesion. Otherwise normal pancreatic contour. No surrounding inflammatory changes. No main pancreatic ductal dilatation. Spleen: Punctate calcifications likely sequelae of prior granulomatous disease. Otherwise normal in size without focal abnormality. Adrenals/Urinary Tract: No adrenal nodule bilaterally. Bilateral renal cortical scarring. Punctate right nephrolithiasis. No left nephrolithiasis. There is a fluid dense lesion within the right kidney that statistically likely represents a simple renal  cyst. Simple renal cysts, in the absence of clinically indicated signs/symptoms, require no independent follow-up. No hydronephrosis. No definite contour-deforming renal mass. No ureterolithiasis or hydroureter. Decompressed urinary bladder with urinary bladder wall haziness and perivascular fat stranding. Stomach/Bowel: Stomach is within normal limits. No evidence of bowel wall thickening or dilatation. Scattered colonic diverticulosis. 6-7 cm stool ball within the rectum. The appendix appears normal. Vascular/Lymphatic: No abdominal aorta or iliac aneurysm. Mild atherosclerotic plaque of the aorta and its branches. No abdominal, pelvic, or inguinal lymphadenopathy. Reproductive: Prostate is unremarkable. Other: No intraperitoneal free fluid. No intraperitoneal free gas. No organized fluid collection. Musculoskeletal: No abdominal wall hernia or abnormality. No suspicious lytic or blastic osseous lesions. No acute displaced fracture. Intervertebral disc space vacuum phenomenon. IMPRESSION: 1. Decompressed urinary bladder with urinary bladder wall haziness and trace perivascular fat stranding. Consider correlation with urinalysis to exclude infection. 2. Nonobstructive right punctate nephrolithiasis. 3. Colonic diverticulosis with no acute diverticulitis. 4. A 6-7 cm stool ball within the rectum. 5. A 4 mm right lower lobe pulmonary nodule. No follow-up needed if patient is low-risk.This recommendation follows the consensus statement: Guidelines for Management of Incidental Pulmonary Nodules Detected on CT Images: From the Fleischner Society 2017; Radiology 2017; 284:228-243. 6.  Aortic Atherosclerosis (ICD10-I70.0). Electronically Signed   By: Iven Finn M.D.   On: 04/20/2022 19:20        Scheduled Meds:  apixaban  5 mg Oral BID   diltiazem  120 mg Oral Daily   insulin aspart  0-5 Units Subcutaneous QHS   insulin aspart  0-9 Units Subcutaneous TID WC   levothyroxine  150 mcg Oral Daily    pravastatin  80 mg Oral QHS   Continuous Infusions:  sodium chloride 100 mL/hr at 04/21/22 2151   cefTRIAXone (ROCEPHIN)  IV 2 g (04/21/22 1850)     LOS: 1 day    Time spent:40 min    Takayla Baillie, Geraldo Docker, MD [facility]   If 7PM-7AM, please contact night-coverage 04/22/2022, 2:47 PM

## 2022-04-23 DIAGNOSIS — I483 Typical atrial flutter: Secondary | ICD-10-CM

## 2022-04-23 DIAGNOSIS — N390 Urinary tract infection, site not specified: Secondary | ICD-10-CM

## 2022-04-23 LAB — CBC WITH DIFFERENTIAL/PLATELET
Abs Immature Granulocytes: 0.1 10*3/uL — ABNORMAL HIGH (ref 0.00–0.07)
Basophils Absolute: 0 10*3/uL (ref 0.0–0.1)
Basophils Relative: 0 %
Eosinophils Absolute: 0.1 10*3/uL (ref 0.0–0.5)
Eosinophils Relative: 2 %
HCT: 39.6 % (ref 39.0–52.0)
Hemoglobin: 13.2 g/dL (ref 13.0–17.0)
Immature Granulocytes: 1 %
Lymphocytes Relative: 14 %
Lymphs Abs: 1 10*3/uL (ref 0.7–4.0)
MCH: 29.9 pg (ref 26.0–34.0)
MCHC: 33.3 g/dL (ref 30.0–36.0)
MCV: 89.6 fL (ref 80.0–100.0)
Monocytes Absolute: 0.8 10*3/uL (ref 0.1–1.0)
Monocytes Relative: 11 %
Neutro Abs: 5.3 10*3/uL (ref 1.7–7.7)
Neutrophils Relative %: 72 %
Platelets: 139 10*3/uL — ABNORMAL LOW (ref 150–400)
RBC: 4.42 MIL/uL (ref 4.22–5.81)
RDW: 13.6 % (ref 11.5–15.5)
WBC: 7.4 10*3/uL (ref 4.0–10.5)
nRBC: 0 % (ref 0.0–0.2)

## 2022-04-23 LAB — COMPREHENSIVE METABOLIC PANEL
ALT: 18 U/L (ref 0–44)
AST: 18 U/L (ref 15–41)
Albumin: 2.8 g/dL — ABNORMAL LOW (ref 3.5–5.0)
Alkaline Phosphatase: 35 U/L — ABNORMAL LOW (ref 38–126)
Anion gap: 8 (ref 5–15)
BUN: 16 mg/dL (ref 8–23)
CO2: 22 mmol/L (ref 22–32)
Calcium: 8 mg/dL — ABNORMAL LOW (ref 8.9–10.3)
Chloride: 108 mmol/L (ref 98–111)
Creatinine, Ser: 0.71 mg/dL (ref 0.61–1.24)
GFR, Estimated: >60 mL/min (ref >60)
Glucose, Bld: 124 mg/dL — ABNORMAL HIGH (ref 70–99)
Potassium: 3.9 mmol/L (ref 3.5–5.1)
Sodium: 138 mmol/L (ref 135–145)
Total Bilirubin: 0.6 mg/dL (ref 0.3–1.2)
Total Protein: 5.8 g/dL — ABNORMAL LOW (ref 6.5–8.1)

## 2022-04-23 LAB — URINE CULTURE: Culture: >=100000 — AB

## 2022-04-23 LAB — FERRITIN: Ferritin: 197 ng/mL (ref 24–336)

## 2022-04-23 LAB — GLUCOSE, CAPILLARY
Glucose-Capillary: 136 mg/dL — ABNORMAL HIGH (ref 70–99)
Glucose-Capillary: 121 mg/dL — ABNORMAL HIGH (ref 70–99)

## 2022-04-23 LAB — MAGNESIUM: Magnesium: 2 mg/dL (ref 1.7–2.4)

## 2022-04-23 LAB — LACTATE DEHYDROGENASE: LDH: 125 U/L (ref 98–192)

## 2022-04-23 LAB — C-REACTIVE PROTEIN: CRP: 11.7 mg/dL — ABNORMAL HIGH (ref <1.0)

## 2022-04-23 LAB — D-DIMER, QUANTITATIVE: D-Dimer, Quant: 0.94 ug/mL-FEU — ABNORMAL HIGH (ref 0.00–0.50)

## 2022-04-23 LAB — PHOSPHORUS: Phosphorus: 2.6 mg/dL (ref 2.5–4.6)

## 2022-04-23 MED ORDER — CEFDINIR 300 MG PO CAPS: 300.0000 mg | ORAL_CAPSULE | Freq: Two times a day (BID) | ORAL | 0 refills | Status: DC

## 2022-04-23 MED ORDER — CEFDINIR 300 MG PO CAPS
300.0000 mg | ORAL_CAPSULE | Freq: Two times a day (BID) | ORAL | Status: DC
  Administered 2022-04-23: 300 mg via ORAL
  Filled 2022-04-23: qty 1

## 2022-04-23 NOTE — Progress Notes (Signed)
OT Cancellation Note  Patient Details Name: Frederick Michael MRN: [id number] DOB: May 10, 1946   Cancelled Treatment:    Reason Eval/Treat Not Completed: OT screened, no needs identified, will sign off Per PT, no therapy needs at this time. OT to sign off. Please re order if there are any changes Jackelyn Poling OTR/L, MS Acute Rehabilitation Department Office# [phone] Pager# [phone]  04/23/2022, 2:31 PM

## 2022-04-23 NOTE — Progress Notes (Signed)
PT Cancellation Note  Patient Details Name: Frederick Michael MRN: [id number] DOB: [birth year]   Cancelled Treatment:    Reason Eval/Treat Not Completed:  PT eval ordered but not needed. Screen. Will sign off.    [facility]  Office: [phone] Pager: [phone]

## 2022-04-23 NOTE — Plan of Care (Signed)

## 2022-04-23 NOTE — Progress Notes (Signed)
Initial Nutrition Assessment  DOCUMENTATION CODES:   Not applicable  INTERVENTION:  - if patient unable to d/c today, will provide interventions.  - complete NFPE if patient unable to d/c today.    NUTRITION DIAGNOSIS:   Increased nutrient needs related to acute illness as evidenced by estimated needs.  GOAL:   Patient will meet greater than or equal to 90% of their needs  MONITOR:   PO intake, Labs, Weight trends  REASON FOR ASSESSMENT:   Malnutrition Screening Tool  ASSESSMENT:   76 year old male with medical history of a.flutter, HTN, HLD, and thyroid disease. He tested positive for COVID using a home test 6 days PTA. He was experiencing fever and back pain and developed dysuria the night PTA. CT urogram was negative for pyelonephritis.  He has small non-obstructive kidney stone. There was a 6 cm stool ball in the rectum. He was admitted for acute cystitis without hematuria.  Unable to see patient at time of attempted visit. Patient has been eating 80-100% at meals since admission with most recently documented meal intake being 100% of breakfast today (419 kcal and 24 grams protein).   Discharge order entered a short time ago. Discharge summary not yet entered.   Weight on 6/10 was 204 lb and weight on 02/15/22 was 210 lb. This indicates 6 lb weight loss (2.8% body weight) in the past 2 months; not significant for time frame.  Note in MST screen indicates that patient reported losing 3 lb recently.   No information documented in the edema section of flow sheet this admission.      Labs reviewed; CBGs: 136 and 121 mg/dl, Ca: 8 mg/dl. Medications reviewed; sliding scale novolog, 150 mcg oral synthroid/day.  IVF; NS @ 100 ml/hr.    Diet Order:   Diet Order             Diet heart healthy/carb modified Room service appropriate? Yes; Fluid consistency: Thin  Diet effective now                   EDUCATION NEEDS:   No education needs have been identified at  this time  Skin:  Skin Assessment: Reviewed RN Assessment  Last BM:  PTA/unknown  Height:   Ht Readings from Last 1 Encounters:  04/20/22 '6\' 3"'$  (1.905 m)    Weight:   Wt Readings from Last 1 Encounters:  04/21/22 92.6 kg     BMI:  Body mass index is 25.52 kg/m.  Estimated Nutritional Needs:  Kcal:  2200-2400 kcal Protein:  110-120 grams Fluid:  >/= 2.5 L/day     Jarome Matin, MS, RD, LDN Registered Dietitian II Inpatient Clinical Nutrition RD pager # and on-call/weekend pager # available in [facility]

## 2022-04-23 NOTE — Progress Notes (Signed)
Provided and discussed discharge instructions. Addressed all questions and concerns. Iv removed intact. Frederick Michael N Frederick Michael  

## 2022-04-23 NOTE — Discharge Summary (Signed)
Physician Discharge Summary  Frederick Michael GOT:[id number] DOB: [birth year] DOA: 04/20/2022  PCP: Frederick Pao, MD  Admit date: 04/20/2022 Discharge date: 04/23/2022  Time spent: 35 minutes  Recommendations for Outpatient Follow-up:   Acute cystitis without hematuria/positive GNR -Continue with IV rocephin 2 grams.  -Continue with IVF LR @ 100 ml/hr x 10 hours. -Acute cystitis without hematuria is a Acute illness/condition that poses a threat to life or bodily function.     COVID-19 virus infection COVID-19 Labs  Recent Labs    04/21/22 0845 04/22/22 0401 04/23/22 0715  DDIMER 0.59* 0.56* 0.94*  FERRITIN 247 204 197  LDH 132 124 125  CRP 21.6* 21.4* 11.7*    Lab Results  Component Value Date   [place] NEGATIVE 01/27/2022    Sepsis with acute organ dysfunction without septic shock ([place]) -Due to UTI. Evidenced by elevated lactic acid. Repeat CBC in AM.    Lactic acidosis - Trend lactic acid     Latest Reference Range & Units 04/20/22 18:30 04/20/22 20:22 04/21/22 08:45 04/21/22 11:38  Lactic Acid, Venous 0.5 - 1.9 mmol/L 4.5 (HH) 2.4 (HH) 1.5 1.8  (Frederick Michael): Data is critically high -6/10 normal saline 137m/hr   HLD (hyperlipidemia) Stable. Continue pravachol 80 mg qd.    DM type II uncontrolled with hyperglycemia -3/20 hemoglobin A1c =7/3  CBG (last 3)  Recent Labs    04/22/22 1948 04/23/22 0805 04/23/22 1155  GLUCAP 135* 136* 121*    HTN (hypertension) -Cardizem CD1 20 mg daily  -Follow-up PCP in 1 to 2 weeks sepsis, COVID infection.  He will decide when/if to restart any additional BP medication .   Typical atrial flutter (HCC) -Stable.  Currently NSR.  -Eliquis 5 mg bid.   Prolonged QT interval -New. QTc 516 msec.  -IV mag 2 grams. Repeat EKG in AM.   Hypocalcemia -6/11 corrected calcium= 9.1 therefore no requirement replace at this time, continue to monitor      Discharge Diagnoses:  Principal Problem:   Acute cystitis without  hematuria Active Problems:   Sepsis with acute organ dysfunction without septic shock ([facility]   COVID-19 virus infection   Typical atrial flutter (HCC)   HTN (hypertension)   DM2 (diabetes mellitus, type 2) (HCC)   HLD (hyperlipidemia)   Hypothyroidism   Prolonged QT interval   Discharge Condition: Stable  Diet recommendation: Heart healthy  Filed Weights   04/20/22 1750 04/21/22 0500  Weight: 92.5 kg 92.6 kg    History of present illness:  76year old WM PMHx DM Type II diet-controlled, A- flutter on Eliquis on Cardizem, HTN, HLD, positive COVID-19 infection about 6 days ago    Presents to the ER today with a 1 day history of fever and back pain.  Patient was diagnosed with COVID about 6 days ago.  He took a home test.  He called his primary care physician.  He has been on molnupiravir for the last 4 days.  His last dose is tonight.  He developed severe back pain along with dysuria last night.  He started having fevers today.   On arrival to the ER, initial temperature was 102 and increased to 104.5.  Heart rate 86.  Blood pressure 114/62.   Labs showed a white count of 9.1, hemoglobin 15.6, platelets of 141   Initial lactic acid was elevated at 4.5.   Urinalysis showed large blood, 21-50 RBCs, nitrate positive, leukocyte is positive, bacteria many.   Sodium 138, potassium 3.5 chloride 102, bicarb 19, BUN 25,  creatinine 1.23   Blood and urine cultures were obtained.   CT urogram was negative for pyelonephritis.  He has small nonobstructive kidney stone.  There was a 6 cm stool ball in the rectum.  Patient states he has not had a bowel movement in a least 1 day.  Hospital Course:  -See above  Procedures:   Consultations:    Cultures  6/9 positive E. coli 6/9 blood RIGHT AC NGTD 6/9 blood LEFT AC NGTD  Antibiotics Anti-infectives (From admission, onward)    Start     Ordered Stop   04/23/22 1030  cefdinir (OMNICEF) capsule 300 mg        04/23/22 0933      04/23/22 0000  cefdinir (OMNICEF) 300 MG capsule        04/23/22 1435     04/20/22 1930  molnupiravir EUA (LAGEVRIO) capsule 800 mg  Status:  Discontinued       Note to Pharmacy: This is patient's last dose on previous treatment   04/20/22 1920 04/20/22 1926   04/20/22 1915  cefTRIAXone (ROCEPHIN) 2 g in sodium chloride 0.9 % 100 mL IVPB  Status:  Discontinued        04/20/22 1909 04/23/22 0933         Discharge Exam: Vitals:   04/21/22 2128 04/22/22 2240 04/23/22 0537 04/23/22 1358  BP: (!) 123/59 127/67 134/74 (!) 149/68  Pulse: (!) 52 (!) 49 (!) 53 (!) 57  Resp: '17 17 17 20  '$ Temp: 98.4 F (36.9 C) 97.9 F (36.6 C) 97.9 F (36.6 C) 98.3 F (36.8 C)  TempSrc: Oral   Oral  SpO2: 97% 96% 98% 98%  Weight:      Height:        General: A/O x4 No acute respiratory distress Eyes: negative scleral hemorrhage, negative anisocoria, negative icterus ENT: Negative Runny nose, negative gingival bleeding, Neck:  Negative scars, masses, torticollis, lymphadenopathy, JVD Lungs: Clear to auscultation bilaterally without wheezes or crackles Cardiovascular: Regular rate and rhythm without murmur gallop or rub normal S1 and S2 Abdomen: negative abdominal pain, nondistended, positive soft, bowel sounds, no rebound, no ascites, no appreciable mass  Discharge Instructions   Allergies as of 04/23/2022   No Known Allergies      Medication List     STOP taking these medications    hydrochlorothiazide 12.5 MG capsule Commonly known as: MICROZIDE   hydrochlorothiazide 12.5 MG tablet Commonly known as: HYDRODIURIL   Lagevrio 200 MG Caps capsule Generic drug: molnupiravir EUA   lisinopril 40 MG tablet Commonly known as: ZESTRIL       TAKE these medications    apixaban 5 MG Tabs tablet Commonly known as: ELIQUIS Take 1 tablet (5 mg total) by mouth 2 (two) times daily.   cefdinir 300 MG capsule Commonly known as: OMNICEF Take 1 capsule (300 mg total) by mouth every 12  (twelve) hours.   diltiazem 120 MG 24 hr capsule Commonly known as: CARDIZEM CD Take 1 capsule (120 mg total) by mouth daily. What changed: Another medication with the same name was removed. Continue taking this medication, and follow the directions you see here.   levothyroxine 150 MCG tablet Commonly known as: SYNTHROID Take 150 mcg by mouth daily.   pravastatin 80 MG tablet Commonly known as: PRAVACHOL Take 80 mg by mouth at bedtime.   SALONPAS EX Apply 1 patch topically daily as needed (pain).       No Known Allergies    The results of significant  diagnostics from this hospitalization (including imaging, microbiology, ancillary and laboratory) are listed below for reference.    Significant Diagnostic Studies: DG Chest Port 1 View  Result Date: 04/20/2022 CLINICAL DATA:  Questionable sepsis - evaluate for abnormality EXAM: PORTABLE CHEST 1 VIEW COMPARISON:  Chest x-ray 01/27/2022 FINDINGS: The heart and mediastinal contours are unchanged. No focal consolidation. No pulmonary edema. No pleural effusion. No pneumothorax. No acute osseous abnormality. IMPRESSION: No active disease. Electronically Signed   By: Iven Finn M.D.   On: 04/20/2022 19:22   CT Renal Stone Study  Result Date: 04/20/2022 CLINICAL DATA:  Flank pain, kidney stone suspected EXAM: CT ABDOMEN AND PELVIS WITHOUT CONTRAST TECHNIQUE: Multidetector CT imaging of the abdomen and pelvis was performed following the standard protocol without IV contrast. RADIATION DOSE REDUCTION: This exam was performed according to the departmental dose-optimization program which includes automated exposure control, adjustment of the mA and/or kV according to patient size and/or use of iterative reconstruction technique. COMPARISON:  None Available. FINDINGS: Lower chest: 4 mm pulmonary nodule within the right lower lobe. Tiny hiatal hernia. Otherwise no acute abnormality. Hepatobiliary: No focal liver abnormality. No gallstones,  gallbladder wall thickening, or pericholecystic fluid. No biliary dilatation. Pancreas: Diffusely atrophic. No focal lesion. Otherwise normal pancreatic contour. No surrounding inflammatory changes. No main pancreatic ductal dilatation. Spleen: Punctate calcifications likely sequelae of prior granulomatous disease. Otherwise normal in size without focal abnormality. Adrenals/Urinary Tract: No adrenal nodule bilaterally. Bilateral renal cortical scarring. Punctate right nephrolithiasis. No left nephrolithiasis. There is a fluid dense lesion within the right kidney that statistically likely represents a simple renal cyst. Simple renal cysts, in the absence of clinically indicated signs/symptoms, require no independent follow-up. No hydronephrosis. No definite contour-deforming renal mass. No ureterolithiasis or hydroureter. Decompressed urinary bladder with urinary bladder wall haziness and perivascular fat stranding. Stomach/Bowel: Stomach is within normal limits. No evidence of bowel wall thickening or dilatation. Scattered colonic diverticulosis. 6-7 cm stool ball within the rectum. The appendix appears normal. Vascular/Lymphatic: No abdominal aorta or iliac aneurysm. Mild atherosclerotic plaque of the aorta and its branches. No abdominal, pelvic, or inguinal lymphadenopathy. Reproductive: Prostate is unremarkable. Other: No intraperitoneal free fluid. No intraperitoneal free gas. No organized fluid collection. Musculoskeletal: No abdominal wall hernia or abnormality. No suspicious lytic or blastic osseous lesions. No acute displaced fracture. Intervertebral disc space vacuum phenomenon. IMPRESSION: 1. Decompressed urinary bladder with urinary bladder wall haziness and trace perivascular fat stranding. Consider correlation with urinalysis to exclude infection. 2. Nonobstructive right punctate nephrolithiasis. 3. Colonic diverticulosis with no acute diverticulitis. 4. A 6-7 cm stool ball within the rectum. 5. A 4 mm  right lower lobe pulmonary nodule. No follow-up needed if patient is low-risk.This recommendation follows the consensus statement: Guidelines for Management of Incidental Pulmonary Nodules Detected on CT Images: From the Fleischner Society 2017; Radiology 2017; 284:228-243. 6.  Aortic Atherosclerosis (ICD10-I70.0). Electronically Signed   By: Iven Finn M.D.   On: 04/20/2022 19:20    Microbiology: Recent Results (from the past 240 hour(s))  Blood Culture (routine x 2)     Status: None (Preliminary result)   Collection Time: 04/20/22  6:30 PM   Specimen: BLOOD  Result Value Ref Range Status   Specimen Description   Final    BLOOD RIGHT ANTECUBITAL Performed at [facility], [place] [address], [place], [place] [address]    Special Requests   Final    BOTTLES DRAWN AEROBIC AND ANAEROBIC Blood Culture adequate volume Performed at [facility], [place] [address] [place], [place] [address]    Culture   Final    NO GROWTH 3 DAYS Performed at [facility], [place] [address], [place], [place] [address]    Report Status PENDING  Incomplete  Blood Culture (routine x 2)     Status: None (Preliminary result)   Collection Time: 04/20/22  6:35 PM   Specimen: BLOOD  Result Value Ref Range Status   Specimen Description   Final    BLOOD LEFT ANTECUBITAL Performed at [facility] [address] [place], [place] [address]    Special Requests   Final    BOTTLES DRAWN AEROBIC ONLY Blood Culture results may not be optimal due to an excessive volume of blood received in culture bottles Performed at [facility] [address], [place], [place] [address]    Culture   Final    NO GROWTH 3 DAYS Performed at [facility], [place] [address] [place], [place] [address]    Report Status PENDING  Incomplete  Urine Culture     Status: Abnormal   Collection Time: 04/20/22  6:43 PM   Specimen: In/Out Cath Urine  Result Value  Ref Range Status   Specimen Description   Final    IN/OUT CATH URINE Performed at [facility] [address], [place], [place] [address]    Special Requests   Final    NONE Performed at [facility], [place] [address] [place], [place] [address]    Culture >=100,000 COLONIES/mL ESCHERICHIA COLI (A)  Final   Report Status 04/23/2022 FINAL  Final   Organism ID, Bacteria ESCHERICHIA COLI (A)  Final      Susceptibility   Escherichia coli - MIC*    AMPICILLIN 8 SENSITIVE Sensitive     CEFAZOLIN <=4 SENSITIVE Sensitive     CEFEPIME <=0.12 SENSITIVE Sensitive     CEFTRIAXONE <=0.25 SENSITIVE Sensitive     CIPROFLOXACIN <=0.25 SENSITIVE Sensitive     GENTAMICIN <=1 SENSITIVE Sensitive     IMIPENEM <=0.25 SENSITIVE Sensitive     NITROFURANTOIN <=16 SENSITIVE Sensitive     TRIMETH/SULFA <=20 SENSITIVE Sensitive     AMPICILLIN/SULBACTAM 4 SENSITIVE Sensitive     PIP/TAZO <=4 SENSITIVE Sensitive     * >=100,000 COLONIES/mL ESCHERICHIA COLI     Labs: Basic Metabolic Panel: Recent Labs  Lab 04/20/22 1830 04/21/22 0436 04/22/22 0401 04/23/22 0715  NA 138 137 136 138  K 3.5 3.6 3.6 3.9  CL 102 101 106 108  CO2 19* '27 24 22  '$ GLUCOSE 191* 168* 137* 124*  BUN '23 22 17 16  '$ CREATININE 1.23 1.03 0.81 0.71  CALCIUM 9.1 8.7* 8.1* 8.0*  MG  --  1.9 2.2 2.0  PHOS  --   --  2.1* 2.6   Liver Function Tests: Recent Labs  Lab 04/20/22 1830 04/21/22 0436 04/22/22 0401 04/23/22 0715  AST '31 26 19 18  '$ ALT '26 21 17 18  '$ ALKPHOS 51 34* 34* 35*  BILITOT 1.5* 0.6 0.7 0.6  PROT 7.4 6.5 5.7* 5.8*  ALBUMIN 3.8 3.1* 2.7* 2.8*   No results for input(s): "LIPASE", "AMYLASE" in the last 168 hours. No results for input(s): "AMMONIA" in the last 168 hours. CBC: Recent Labs  Lab 04/20/22 1830 04/21/22 0436 04/22/22 0401 04/23/22 0353  WBC 9.1 14.1* 8.6 7.4  NEUTROABS 8.3* 12.0* 6.3 5.3  HGB 15.6 14.2 12.2* 13.2  HCT 46.2 42.8 37.4* 39.6  MCV 89.4 89.9  91.7 89.6  PLT 141* 113* 113* 139*   Cardiac Enzymes: No results for input(s): "CKTOTAL", "CKMB", "CKMBINDEX", "TROPONINI" in the last 168 hours. BNP: BNP (last 3 results) No results for input(s): "BNP" in the last 8760 hours.  ProBNP (last 3 results) No results for input(s): "PROBNP" in the last 8760 hours.  CBG: Recent Labs  Lab 04/22/22 1239 04/22/22 1638 04/22/22 1948 04/23/22 0805 04/23/22 1155  GLUCAP 130* 146* 135* 136* 121*       Signed:  Dia Crawford, MD [facility]

## 2022-04-23 NOTE — Progress Notes (Signed)
SATURATION QUALIFICATIONS: (This note is used to comply with regulatory documentation for home oxygen)  Patient Saturations on Room Air at Rest = 97%  Patient Saturations on Room Air while Ambulating = 98%  Patient Saturations on 0 Liters of oxygen while Ambulating = 98% Jerene Pitch

## 2022-04-24 ENCOUNTER — Encounter: Payer: Self-pay | Admitting: Cardiology

## 2022-04-25 ENCOUNTER — Encounter: Payer: Self-pay | Admitting: *Deleted

## 2022-04-25 LAB — CULTURE, BLOOD (ROUTINE X 2)
Culture: NO GROWTH
Culture: NO GROWTH
Special Requests: ADEQUATE

## 2022-04-25 NOTE — Telephone Encounter (Signed)
Called and spoke to patient, all questions answered

## 2022-04-25 NOTE — Telephone Encounter (Signed)
Pt aware procedure time to arrive on 7/5 has changed. Aware to arrive at 12:30 pm Informed that I would send updated procedure instruction letter today. Patient verbalized understanding and agreeable to plan.

## 2022-04-30 DIAGNOSIS — I480 Paroxysmal atrial fibrillation: Secondary | ICD-10-CM | POA: Diagnosis not present

## 2022-04-30 DIAGNOSIS — E119 Type 2 diabetes mellitus without complications: Secondary | ICD-10-CM | POA: Diagnosis not present

## 2022-04-30 DIAGNOSIS — I10 Essential (primary) hypertension: Secondary | ICD-10-CM | POA: Diagnosis not present

## 2022-04-30 DIAGNOSIS — U071 COVID-19: Secondary | ICD-10-CM | POA: Diagnosis not present

## 2022-05-14 NOTE — Pre-Procedure Instructions (Signed)
Attempted to call patient regarding procedure instructions.  No answer 

## 2022-05-16 ENCOUNTER — Other Ambulatory Visit: Payer: Self-pay

## 2022-05-16 ENCOUNTER — Ambulatory Visit (HOSPITAL_COMMUNITY)
Admission: RE | Admit: 2022-05-16 | Discharge: 2022-05-16 | Disposition: A | Discharge status: Home / Self Care | Payer: Medicare Other | Source: Ambulatory Visit | Attending: Cardiology | Admitting: Cardiology

## 2022-05-16 ENCOUNTER — Encounter (HOSPITAL_COMMUNITY)
Admission: RE | Disposition: A | Discharge status: Home / Self Care | Payer: Medicare Other | Source: Ambulatory Visit | Attending: Cardiology

## 2022-05-16 ENCOUNTER — Encounter (HOSPITAL_COMMUNITY): Payer: Self-pay | Admitting: Cardiology

## 2022-05-16 ENCOUNTER — Ambulatory Visit (HOSPITAL_COMMUNITY): Payer: Medicare Other | Admitting: Certified Registered Nurse Anesthetist

## 2022-05-16 ENCOUNTER — Ambulatory Visit (HOSPITAL_BASED_OUTPATIENT_CLINIC_OR_DEPARTMENT_OTHER): Payer: Medicare Other | Admitting: Certified Registered Nurse Anesthetist

## 2022-05-16 DIAGNOSIS — I483 Typical atrial flutter: Secondary | ICD-10-CM | POA: Diagnosis not present

## 2022-05-16 DIAGNOSIS — I4891 Unspecified atrial fibrillation: Secondary | ICD-10-CM

## 2022-05-16 DIAGNOSIS — E785 Hyperlipidemia, unspecified: Secondary | ICD-10-CM | POA: Diagnosis not present

## 2022-05-16 DIAGNOSIS — I10 Essential (primary) hypertension: Secondary | ICD-10-CM | POA: Insufficient documentation

## 2022-05-16 DIAGNOSIS — E119 Type 2 diabetes mellitus without complications: Secondary | ICD-10-CM | POA: Diagnosis not present

## 2022-05-16 DIAGNOSIS — E039 Hypothyroidism, unspecified: Secondary | ICD-10-CM

## 2022-05-16 DIAGNOSIS — Z87891 Personal history of nicotine dependence: Secondary | ICD-10-CM | POA: Diagnosis not present

## 2022-05-16 DIAGNOSIS — I4892 Unspecified atrial flutter: Secondary | ICD-10-CM | POA: Diagnosis not present

## 2022-05-16 HISTORY — PX: A-FLUTTER ABLATION: EP1230

## 2022-05-16 LAB — GLUCOSE, CAPILLARY: Glucose-Capillary: 121 mg/dL — ABNORMAL HIGH (ref 70–99)

## 2022-05-16 SURGERY — A-FLUTTER ABLATION
Anesthesia: General

## 2022-05-16 MED ORDER — PHENYLEPHRINE HCL-NACL 20-0.9 MG/250ML-% IV SOLN
INTRAVENOUS | Status: DC | PRN
  Administered 2022-05-16: 30 ug/min via INTRAVENOUS

## 2022-05-16 MED ORDER — SODIUM CHLORIDE 0.9% FLUSH: 3.0000 mL | INTRAVENOUS | Status: DC | PRN

## 2022-05-16 MED ORDER — SUGAMMADEX SODIUM 200 MG/2ML IV SOLN
INTRAVENOUS | Status: DC | PRN
  Administered 2022-05-16: 200 mg via INTRAVENOUS

## 2022-05-16 MED ORDER — ROCURONIUM BROMIDE 10 MG/ML (PF) SYRINGE
PREFILLED_SYRINGE | INTRAVENOUS | Status: DC | PRN
  Administered 2022-05-16: 60 mg via INTRAVENOUS

## 2022-05-16 MED ORDER — LIDOCAINE 2% (20 MG/ML) 5 ML SYRINGE
INTRAMUSCULAR | Status: DC | PRN
  Administered 2022-05-16: 60 mg via INTRAVENOUS

## 2022-05-16 MED ORDER — HEPARIN (PORCINE) IN NACL 1000-0.9 UT/500ML-% IV SOLN
INTRAVENOUS | Status: DC | PRN
  Administered 2022-05-16 (×2): 500 mL

## 2022-05-16 MED ORDER — PROPOFOL 10 MG/ML IV BOLUS
INTRAVENOUS | Status: DC | PRN
  Administered 2022-05-16: 20 mg via INTRAVENOUS
  Administered 2022-05-16: 150 mg via INTRAVENOUS

## 2022-05-16 MED ORDER — FENTANYL CITRATE (PF) 100 MCG/2ML IJ SOLN
INTRAMUSCULAR | Status: DC | PRN
  Administered 2022-05-16 (×2): 50 ug via INTRAVENOUS

## 2022-05-16 MED ORDER — SODIUM CHLORIDE 0.9 % IV SOLN: 250.0000 mL | INTRAVENOUS | Status: DC | PRN

## 2022-05-16 MED ORDER — ACETAMINOPHEN 325 MG PO TABS: 650.0000 mg | ORAL_TABLET | ORAL | Status: DC | PRN

## 2022-05-16 MED ORDER — DEXAMETHASONE SODIUM PHOSPHATE 10 MG/ML IJ SOLN
INTRAMUSCULAR | Status: DC | PRN
  Administered 2022-05-16: 5 mg via INTRAVENOUS

## 2022-05-16 MED ORDER — FENTANYL CITRATE (PF) 250 MCG/5ML IJ SOLN
INTRAMUSCULAR | Status: DC | PRN
Start: 2022-05-16 — End: 2022-05-16

## 2022-05-16 MED ORDER — HEPARIN (PORCINE) IN NACL 1000-0.9 UT/500ML-% IV SOLN
INTRAVENOUS | Status: AC
  Filled 2022-05-16: qty 500

## 2022-05-16 MED ORDER — SODIUM CHLORIDE 0.9 % IV SOLN: INTRAVENOUS | Status: DC

## 2022-05-16 MED ORDER — ONDANSETRON HCL 4 MG/2ML IJ SOLN
INTRAMUSCULAR | Status: DC | PRN
  Administered 2022-05-16: 4 mg via INTRAVENOUS

## 2022-05-16 SURGICAL SUPPLY — 10 items
CATH EZ STEER NAV 8MM F-J CUR (ABLATOR) ×1 IMPLANT
CATH WEB BI DIR CSDF CRV REPRO (CATHETERS) ×1 IMPLANT
CLOSURE PERCLOSE PROSTYLE (VASCULAR PRODUCTS) ×2 IMPLANT
PACK EP LATEX FREE (CUSTOM PROCEDURE TRAY) ×2
PACK EP LF (CUSTOM PROCEDURE TRAY) ×1 IMPLANT
PAD DEFIB RADIO PHYSIO CONN (PAD) ×2 IMPLANT
PATCH CARTO3 (PAD) ×1 IMPLANT
SHEATH PINNACLE 7F 10CM (SHEATH) ×1 IMPLANT
SHEATH PINNACLE 8F 10CM (SHEATH) ×1 IMPLANT
SHEATH PROBE COVER 6X72 (BAG) ×1 IMPLANT

## 2022-05-16 NOTE — Anesthesia Procedure Notes (Signed)
Procedure Name: Intubation Date/Time: 05/16/2022 1:28 PM  Performed by: Dorthea Cove, CRNAPre-anesthesia Checklist: Patient identified, Emergency Drugs available, Suction available and Patient being monitored Patient Re-evaluated:Patient Re-evaluated prior to induction Oxygen Delivery Method: Circle System Utilized Preoxygenation: Pre-oxygenation with 100% oxygen Induction Type: IV induction Ventilation: Mask ventilation without difficulty Laryngoscope Size: Mac and 4 Grade View: Grade II Tube type: Oral Tube size: 7.5 mm Number of attempts: 1 Airway Equipment and Method: Stylet Placement Confirmation: ETT inserted through vocal cords under direct vision, positive ETCO2 and breath sounds checked- equal and bilateral Secured at: 23 cm Tube secured with: Tape Dental Injury: Teeth and Oropharynx as per pre-operative assessment

## 2022-05-16 NOTE — Anesthesia Postprocedure Evaluation (Signed)
Anesthesia Post Note  Patient: Frederick Michael  Procedure(s) Performed: A-FLUTTER ABLATION     Patient location during evaluation: PACU Anesthesia Type: General Level of consciousness: sedated Pain management: pain level controlled Vital Signs Assessment: post-procedure vital signs reviewed and stable Respiratory status: spontaneous breathing and respiratory function stable Cardiovascular status: stable Postop Assessment: no apparent nausea or vomiting Anesthetic complications: no   There were no known notable events for this encounter.  Last Vitals:  Vitals:   05/16/22 1500 05/16/22 1501  BP:  (!) 118/55  Pulse:  (!) 51  Resp:  17  Temp: 36.6 C   SpO2:  98%    Last Pain:  Vitals:   05/16/22 1430  TempSrc: Temporal  PainSc: 0-No pain                 Teri Diltz DANIEL

## 2022-05-16 NOTE — Transfer of Care (Signed)
Immediate Anesthesia Transfer of Care Note  Patient: Frederick Michael  Procedure(s) Performed: A-FLUTTER ABLATION  Patient Location: Cath Lab  Anesthesia Type:General  Level of Consciousness: awake, alert  and oriented  Airway & Oxygen Therapy: Patient Spontanous Breathing  Post-op Assessment: Report given to RN and Post -op Vital signs reviewed and stable  Post vital signs: Reviewed and stable  Last Vitals:  Vitals Value Taken Time  BP 123/56 05/16/22 1439  Temp 36.6 C 05/16/22 1430  Pulse 51 05/16/22 1444  Resp 17 05/16/22 1444  SpO2 97 % 05/16/22 1444  Vitals shown include unvalidated device data.  Last Pain:  Vitals:   05/16/22 1430  TempSrc: Temporal  PainSc: 0-No pain         Complications: There were no known notable events for this encounter.

## 2022-05-16 NOTE — Anesthesia Preprocedure Evaluation (Addendum)
Anesthesia Evaluation  Patient identified by MRN, date of birth, ID band Patient awake    Reviewed: Allergy & Precautions, H&P , NPO status , Patient's Chart, lab work & pertinent test results  History of Anesthesia Complications Negative for: history of anesthetic complications  Airway Mallampati: III  TM Distance: >3 FB Neck ROM: full    Dental no notable dental hx. (+) Dental Advisory Given   Pulmonary former smoker,    breath sounds clear to auscultation       Cardiovascular hypertension, Pt. on medications + dysrhythmias Atrial Fibrillation  Rhythm:regular Rate:Normal     Neuro/Psych    GI/Hepatic   Endo/Other  diabetes, Type 2Hypothyroidism   Renal/GU      Musculoskeletal   Abdominal   Peds  Hematology   Anesthesia Other Findings   Reproductive/Obstetrics                            Anesthesia Physical Anesthesia Plan  ASA: 3  Anesthesia Plan: General   Post-op Pain Management:    Induction: Intravenous  PONV Risk Score and Plan: 2 and Ondansetron, Dexamethasone and Treatment may vary due to age or medical condition  Airway Management Planned: Oral ETT  Additional Equipment:   Intra-op Plan:   Post-operative Plan: Extubation in OR  Informed Consent: I have reviewed the patients History and Physical, chart, labs and discussed the procedure including the risks, benefits and alternatives for the proposed anesthesia with the patient or authorized representative who has indicated his/her understanding and acceptance.     Dental advisory given  Plan Discussed with: CRNA, Anesthesiologist and Surgeon  Anesthesia Plan Comments:         Anesthesia Quick Evaluation

## 2022-05-16 NOTE — H&P (Signed)
Electrophysiology Office Note   Date:  05/16/2022   ID:  Frederick Michael, Frederick Michael September 10, 1946, MRN [id number]  PCP:  Haywood Pao, MD  Cardiologist: Criotriou Primary Electrophysiologist:  Kaelynne Christley Meredith Leeds, MD    Chief Complaint: Atrial flutter   History of Present Illness: Frederick Michael is a 76 y.o. male who is being seen today for the evaluation of atrial flutter at the request of Coletta Memos. Presenting today for electrophysiology evaluation.  He has a history significant for hypertension and hyperlipidemia.  He presented to the hospital in atrial flutter.  He had a TEE and cardioversion which greatly improved his symptoms.  He was feeling weak, fatigued, short of breath.  That day, he had eaten a meal that was high in vinegar and thought that he initially had GI upset.  He checked his heart rate which was elevated at above 100 bpm.  After his cardioversion, he felt well.  He had no complaints.  Today, denies symptoms of palpitations, chest pain, shortness of breath, orthopnea, PND, lower extremity edema, claudication, dizziness, presyncope, syncope, bleeding, or neurologic sequela. The patient is tolerating medications without difficulties. Plan ablation today for atrial flutter.    Past Medical History:  Diagnosis Date   DM2 (diabetes mellitus, type 2) ([place])    High cholesterol    Hypertension    Thyroid disease    Past Surgical History:  Procedure Laterality Date   CARDIOVERSION N/A 01/30/2022   Procedure: CARDIOVERSION;  Surgeon: Sanda Klein, MD;  Location: [facility];  Service: Cardiovascular;  Laterality: N/A;   ELBOW SURGERY     TEE WITHOUT CARDIOVERSION N/A 01/30/2022   Procedure: TRANSESOPHAGEAL ECHOCARDIOGRAM (TEE);  Surgeon: Sanda Klein, MD;  Location: [facility] ENDOSCOPY;  Service: Cardiovascular;  Laterality: N/A;     No current facility-administered medications for this encounter.    Allergies:   Patient has no known allergies.   Social  History:  The patient  reports that he has quit smoking. His smoking use included cigarettes. He has quit using smokeless tobacco. He reports current alcohol use of about 3.0 standard drinks of alcohol per week. He reports that he does not use drugs.   Family History:  The patient's family history includes Cancer in his father.   ROS:  Please see the history of present illness.   Otherwise, review of systems is positive for none.   All other systems are reviewed and negative.   PHYSICAL EXAM: VS:  There were no vitals taken for this visit. , BMI There is no height or weight on file to calculate BMI. GEN: Well nourished, well developed, in no acute distress  HEENT: normal  Neck: no JVD, carotid bruits, or masses Cardiac: RRR; no murmurs, rubs, or gallops,no edema  Respiratory:  clear to auscultation bilaterally, normal work of breathing GI: soft, nontender, nondistended, + BS MS: no deformity or atrophy  Skin: warm and dry Neuro:  Strength and sensation are intact Psych: euthymic mood, full affect  Recent Labs: 01/28/2022: TSH 8.764 04/23/2022: ALT 18; BUN 16; Creatinine, Ser 0.71; Hemoglobin 13.2; Magnesium 2.0; Platelets 139; Potassium 3.9; Sodium 138    Lipid Panel     Component Value Date/Time   CHOL 125 01/29/2022 0031   TRIG 110 01/29/2022 0031   HDL 25 (L) 01/29/2022 0031   CHOLHDL 5.0 01/29/2022 0031   VLDL 22 01/29/2022 0031   LDLCALC 78 01/29/2022 0031     Wt Readings from Last 3 Encounters:  04/21/22 92.6 kg  02/15/22 95.5  kg  02/13/22 95.1 kg      Other studies Reviewed: Additional studies/ records that were reviewed today include: TTE 01/28/22  Review of the above records today demonstrates:   1. Left ventricular ejection fraction, by estimation, is 60 to 65%. The  left ventricle has normal function. The left ventricle has no regional  wall motion abnormalities. There is mild left ventricular hypertrophy of  the basal-septal segment. Left  ventricular  diastolic function could not be evaluated.   2. Right ventricular systolic function is normal. The right ventricular  size is mildly enlarged.   3. Left atrial size was mildly dilated.   4. The mitral valve is normal in structure. No evidence of mitral valve  regurgitation. No evidence of mitral stenosis.   5. The aortic valve is normal in structure. Aortic valve regurgitation is  not visualized. No aortic stenosis is present.   6. The inferior vena cava is normal in size with greater than 50%  respiratory variability, suggesting right atrial pressure of 3 mmHg.    ASSESSMENT AND PLAN:  1.  1.  Typical atrial flutter: [name] has presented today for surgery, with the diagnosis of atrial flutter.  The various methods of treatment have been discussed with the patient and family. After consideration of risks, benefits and other options for treatment, the patient has consented to  Procedure(s): Catheter ablation as a surgical intervention .  Risks include but not limited to complete heart block, stroke, esophageal damage, nerve damage, bleeding, vascular damage, tamponade, perforation, MI, and death. The patient's history has been reviewed, patient examined, no change in status, stable for surgery.  I have reviewed the patient's chart and labs.  Questions were answered to the patient's satisfaction.    Catrice Zuleta Curt Bears, MD 05/16/2022 12:26 PM

## 2022-05-16 NOTE — Discharge Instructions (Addendum)
Post procedure care instructions No driving for 4 days. No lifting over 5 lbs for 1 week. No vigorous or sexual activity for 1 week. You may return to work/your usual activities on 05/24/22. Keep procedure site clean & dry. If you notice increased pain, swelling, bleeding or pus, call/return!  You may shower after 24 hours, but no soaking in baths/hot tubs/pools for 1 week.  Cardiac Ablation, Care After  This sheet gives you information about how to care for yourself after your procedure. Your health care provider may also give you more specific instructions. If you have problems or questions, contact your health care provider. What can I expect after the procedure? After the procedure, it is common to have: Bruising around your puncture site. Tenderness around your puncture site. Skipped heartbeats. Tiredness (fatigue).  Follow these instructions at home: Puncture site care  Follow instructions from your health care provider about how to take care of your puncture site. Make sure you: If present, leave stitches (sutures), skin glue, or adhesive strips in place. These skin closures may need to stay in place for up to 2 weeks. If adhesive strip edges start to loosen and curl up, you may trim the loose edges. Do not remove adhesive strips completely unless your health care provider tells you to do that. If a large square bandage is present, this may be removed 24 hours after surgery.  Check your puncture site every day for signs of infection. Check for: Redness, swelling, or pain. Fluid or blood. If your puncture site starts to bleed, lie down on your back, apply firm pressure to the area, and contact your health care provider. Warmth. Pus or a bad smell. A pea or small marble sized lump at the site is normal and can take up to three months to resolve.  Driving Do not drive for at least 4 days after your procedure or however long your health care provider recommends. (Do not resume driving if  you have previously been instructed not to drive for other health reasons.) Do not drive or use heavy machinery while taking prescription pain medicine. Activity Avoid activities that take a lot of effort for at least 7 days after your procedure. Do not lift anything that is heavier than 5 lb (4.5 kg) for one week.  No sexual activity for 1 week.  Return to your normal activities as told by your health care provider. Ask your health care provider what activities are safe for you. General instructions Take over-the-counter and prescription medicines only as told by your health care provider. Do not use any products that contain nicotine or tobacco, such as cigarettes and e-cigarettes. If you need help quitting, ask your health care provider. You may shower after 24 hours, but Do not take baths, swim, or use a hot tub for 1 week.  Do not drink alcohol for 24 hours after your procedure. Keep all follow-up visits as told by your health care provider. This is important. Contact a health care provider if: You have redness, mild swelling, or pain around your puncture site. You have fluid or blood coming from your puncture site that stops after applying firm pressure to the area. Your puncture site feels warm to the touch. You have pus or a bad smell coming from your puncture site. You have a fever. You have chest pain or discomfort that spreads to your neck, jaw, or arm. You are sweating a lot. You feel nauseous. You have a fast or irregular heartbeat. You have  shortness of breath. You are dizzy or light-headed and feel the need to lie down. You have pain or numbness in the arm or leg closest to your puncture site. Get help right away if: Your puncture site suddenly swells. Your puncture site is bleeding and the bleeding does not stop after applying firm pressure to the area. These symptoms may represent a serious problem that is an emergency. Do not wait to see if the symptoms will go away.  Get medical help right away. Call your local emergency services (911 in the [place] Do not drive yourself to the hospital. Summary After the procedure, it is normal to have bruising and tenderness at the puncture site in your groin, neck, or forearm. Check your puncture site every day for signs of infection. Get help right away if your puncture site is bleeding and the bleeding does not stop after applying firm pressure to the area. This is a medical emergency. This information is not intended to replace advice given to you by your health care provider. Make sure you discuss any questions you have with your health care provider.

## 2022-05-17 ENCOUNTER — Telehealth: Payer: Self-pay | Admitting: Cardiology

## 2022-05-17 ENCOUNTER — Encounter (HOSPITAL_COMMUNITY): Payer: Self-pay | Admitting: Cardiology

## 2022-05-17 ENCOUNTER — Other Ambulatory Visit: Payer: Self-pay

## 2022-05-17 MED ORDER — APIXABAN 5 MG PO TABS: 5.0000 mg | ORAL_TABLET | Freq: Two times a day (BID) | ORAL | 0 refills | Status: DC

## 2022-05-17 MED ORDER — DILTIAZEM HCL ER COATED BEADS 120 MG PO CP24: 120.0000 mg | ORAL_CAPSULE | Freq: Every day | ORAL | 0 refills | Status: DC

## 2022-05-17 NOTE — Telephone Encounter (Signed)
Patient ablation, not a pacemaker/ ICD procedure routing back to triage.

## 2022-05-17 NOTE — Telephone Encounter (Signed)
Patient has questions bout the procedure he had on yesterday about his bandage. Please advise

## 2022-05-17 NOTE — Telephone Encounter (Signed)
Returned call to patient.  Patient states his discharge instructions he received after his ablation procedure yesterday were not clear on what to do with his bandage.  Patient states on page 5 the instructions state if you have a large square bandage this may be removed after 24 hours. Then on the following page it states change or remove dressing after 24 hours (these instructions were highlighted).   Reached out to Myrtie Hawk, RN for clarification on ablation dressings and she states large bandage may be removed after 24 hours.  Informed patient that he may remove the bandage if it has been 24 hours. Reviewed signs/symptoms of infection and instructed him to assess the puncture site. Patient verbalized understanding and states he will follow his discharge instructions and call our office with any questions or concerns.

## 2022-06-19 ENCOUNTER — Ambulatory Visit: Payer: Medicare Other | Admitting: Cardiology

## 2022-06-21 ENCOUNTER — Ambulatory Visit: Payer: Medicare Other | Admitting: Cardiovascular Disease

## 2022-06-25 NOTE — Progress Notes (Unsigned)
Cardiology Clinic Note   Patient Name: Michaiah Holsopple Date of Encounter: 06/26/2022  Primary Care Provider:  Haywood Pao, MD Primary Cardiologist:  Sanda Klein, MD  Patient Profile    Gabino Hagin 76 year old male presents to the clinic today for follow-up evaluation of his atrial flutter post DCCV 01/30/2022 and hypertension.  Past Medical History    Past Medical History:  Diagnosis Date   DM2 (diabetes mellitus, type 2) ([place])    High cholesterol    Hypertension    Thyroid disease    Past Surgical History:  Procedure Laterality Date   A-FLUTTER ABLATION N/A 05/16/2022   Procedure: A-FLUTTER ABLATION;  Surgeon: Constance Haw, MD;  Location: [facility];  Service: Cardiovascular;  Laterality: N/A;   CARDIOVERSION N/A 01/30/2022   Procedure: CARDIOVERSION;  Surgeon: Sanda Klein, MD;  Location: [facility]OSCOPY;  Service: Cardiovascular;  Laterality: N/A;   ELBOW SURGERY     TEE WITHOUT CARDIOVERSION N/A 01/30/2022   Procedure: TRANSESOPHAGEAL ECHOCARDIOGRAM (TEE);  Surgeon: Sanda Klein, MD;  Location: [facility] ENDOSCOPY;  Service: Cardiovascular;  Laterality: N/A;    Allergies  No Known Allergies  History of Present Illness    Uzoma Vivona has a PMH of atrial flutter status post DCCV 01/30/2022, essential hypertension, HLD, diabetes, and hypothyroidism.  He was found to be in rate controlled atrial flutter.  He reported compliance with his Eliquis.  He underwent TEE which showed LVEF 60-65%, no left atrial appendage thrombus, and trivial mitral valve regurgitation with no evidence of stenosis.  He tolerated the DCCV well and was discharged in stable condition on 01/30/2022.  He contacted the office 02/01/2022 with reports of increased/elevated blood pressure.  He was started on hydrochlorothiazide 12.5 mg daily and instructed to have BMP drawn in 1 week.  He presented to the clinic 02/13/22 for follow-up evaluation stated he felt well.  He felt  better instantly after having his cardioversion.  He reported that he was back to his normal activities.  We reviewed safety precautions for Eliquis.  He expressed understanding.  He wanted his diltiazem and apixaban to be sent to [facility].  We reviewed his CHA2DS2-VASc score.  His blood pressure was well controlled . He was  taking lisinopril 20 mg daily and his HCTZ.  I will gave him the salty 6 diet sheet, continued his current medication regimen, and planned follow-up for 3 to 4 months.  He did have follow-up scheduled with Dr. Curt Bears.  He was seen by Dr. Curt Bears on 05/16/2022 for atrial flutter ablation.  He tolerated the procedure well.  Blood pressure postoperatively was 118/55 with a pulse of 51.  He presents to the clinic today for follow-up evaluation states he has been increasing his physical activity and has started using his [employer] again.  He has not quite resumed his previous regimen but continues to uptitrate.  He has not felt any extra heartbeats or irregular heart rate since his ablation.  We reviewed his medications and he reports compliance.  After leaving the hospital post ablation he did note that his blood pressure was lower than normal.  He took only half dose of lisinopril but has since resumed his 20 mg daily and continues to take his diltiazem as prescribed.  We will refill his diltiazem and apixaban.  We will plan follow-up in 6 months.  Today he denies chest pain, shortness of breath, lower extremity edema, fatigue, palpitations, melena, hematuria, hemoptysis, diaphoresis, weakness, presyncope, syncope,  Home Medications    Prior to Admission medications   Medication Sig Start Date End Date Taking? Authorizing Provider  apixaban (ELIQUIS) 5 MG TABS tablet Take 1 tablet (5 mg total) by mouth 2 (two) times daily. 01/30/22 04/30/22  Barb Merino, MD  Camphor-Menthol-Methyl Sal [facility] EX) Apply 1 patch topically daily as needed (pain).    [provider]   diltiazem (CARDIZEM CD) 120 MG 24 hr capsule Take 1 capsule (120 mg total) by mouth daily. 01/31/22 03/02/22  Barb Merino, MD  hydrochlorothiazide (MICROZIDE) 12.5 MG capsule Take 1 capsule (12.5 mg total) by mouth daily. 02/01/22 05/02/22  Deberah Pelton, NP  levothyroxine (SYNTHROID) 150 MCG tablet Take 150 mcg by mouth daily. 12/20/21   [provider]  lisinopril (ZESTRIL) 40 MG tablet Take 40 mg by mouth daily. 02/02/22   [provider]  metFORMIN (GLUCOPHAGE) 500 MG tablet Take 1 tablet (500 mg total) by mouth 2 (two) times daily with a meal. 01/30/22 01/30/23  Barb Merino, MD  pravastatin (PRAVACHOL) 80 MG tablet Take 80 mg by mouth at bedtime. 01/23/22   [provider]    Family History    Family History  Problem Relation Age of Onset   Cancer Father    He indicated that his mother is deceased. He indicated that his father is deceased.  Social History    Social History   Socioeconomic History   Marital status: Married    Spouse name: Not on file   Number of children: Not on file   Years of education: Not on file   Highest education level: Not on file  Occupational History   Not on file  Tobacco Use   Smoking status: Former    Types: Cigarettes   Smokeless tobacco: Former  Substance and Sexual Activity   Alcohol use: Yes    Alcohol/week: 3.0 standard drinks of alcohol    Types: 3 Glasses of wine per week   Drug use: Never   Sexual activity: Not on file  Other Topics Concern   Not on file  Social History Narrative   Not on file   Social Determinants of Health   Financial Resource Strain: Not on file  Food Insecurity: Not on file  Transportation Needs: Not on file  Physical Activity: Not on file  Stress: Not on file  Social Connections: Not on file  Intimate Partner Violence: Not on file     Review of Systems    General:  No chills, fever, night sweats or weight changes.  Cardiovascular:  No chest pain, dyspnea on exertion,  edema, orthopnea, palpitations, paroxysmal nocturnal dyspnea. Dermatological: No rash, lesions/masses Respiratory: No cough, dyspnea Urologic: No hematuria, dysuria Abdominal:   No nausea, vomiting, diarrhea, bright red blood per rectum, melena, or hematemesis Neurologic:  No visual changes, wkns, changes in mental status. All other systems reviewed and are otherwise negative except as noted above.  Physical Exam    VS:  BP 124/62   Pulse (!) 50   Ht 6' 2.5" (1.892 m)   Wt 206 lb (93.4 kg)   SpO2 96%   BMI 26.10 kg/m  , BMI Body mass index is 26.1 kg/m. GEN: Well nourished, well developed, in no acute distress. HEENT: normal. Neck: Supple, no JVD, carotid bruits, or masses. Cardiac: RRR, no murmurs, rubs, or gallops. No clubbing, cyanosis, edema.  Radials/DP/PT 2+ and equal bilaterally.  Respiratory:  Respirations regular and unlabored, clear to auscultation bilaterally. GI: Soft, nontender, nondistended, BS + x  4. MS: no deformity or atrophy. Skin: warm and dry, no rash. Neuro:  Strength and sensation are intact. Psych: Normal affect.  Accessory Clinical Findings    Recent Labs: 01/28/2022: TSH 8.764 04/23/2022: ALT 18; BUN 16; Creatinine, Ser 0.71; Hemoglobin 13.2; Magnesium 2.0; Platelets 139; Potassium 3.9; Sodium 138   Recent Lipid Panel    Component Value Date/Time   CHOL 125 01/29/2022 0031   TRIG 110 01/29/2022 0031   HDL 25 (L) 01/29/2022 0031   CHOLHDL 5.0 01/29/2022 0031   VLDL 22 01/29/2022 0031   LDLCALC 78 01/29/2022 0031    ECG personally reviewed by me today-sinus bradycardia 46 bpm- No acute changes  Echocardiogram 01/30/2029  IMPRESSIONS     1. Left ventricular ejection fraction, by estimation, is 60 to 65%. The  left ventricle has normal function. The left ventricle has no regional  wall motion abnormalities.   2. Right ventricular systolic function is normal. The right ventricular  size is normal.   3. Left atrial size was mildly dilated. No  left atrial/left atrial  appendage thrombus was detected. The LAA emptying velocity was 60 cm/s.   4. The mitral valve is normal in structure. Trivial mitral valve  regurgitation. No evidence of mitral stenosis.   5. The aortic valve is normal in structure. Aortic valve regurgitation is  mild. No aortic stenosis is present.   6. The inferior vena cava is normal in size with greater than 50%  respiratory variability, suggesting right atrial pressure of 3 mmHg.  Assessment & Plan   1.  Atrial flutter-heart rate today 50 bpm.  No recent episodes of increased or irregular heartbeat.  Remains cardiac unaware.  Compliance with Eliquis.  No bleeding issues.  Underwent ablation on 05/16/2022. Continue Eliquis, diltiazem Heart healthy low-sodium diet Increase physical activity as tolerated Avoid triggers caffeine, chocolate, EtOH, dehydration etc. This patients CHA2DS2-VASc Score and unadjusted Ischemic Stroke Rate (% per year) is equal to 3.2 % stroke rate/year from a score of 3   Essential hypertension-BP today 124/62.  Well-controlled at home.   Continue HCTZ, diltiazem, lisinopril Heart healthy low-sodium diet-salty 6 reviewed Increase physical activity as tolerated  Hyperlipidemia-LDL 78 on 01/29/2022. Continue pravastatin Heart healthy low-sodium high-fiber diet Increase physical activity as tolerated Follows with PCP   Disposition: Follow-up with Dr. Sallyanne Kuster or me in 6 months.   Jossie Ng. Wahneta Derocher NP-C    06/26/2022, 1:32 PM [facility] [address] Office [phone] Fax [phone]  Notice: This dictation was prepared with Dragon dictation along with smaller phrase technology. Any transcriptional errors that result from this process are unintentional and may not be corrected upon review.  I spent 13 minutes examining this patient, reviewing medications, and using patient centered shared decision making involving her cardiac care.  Prior  to her visit I spent greater than 20 minutes reviewing her past medical history,  medications, and prior cardiac tests.

## 2022-06-26 ENCOUNTER — Encounter: Payer: Self-pay | Admitting: General Practice

## 2022-06-26 ENCOUNTER — Ambulatory Visit (INDEPENDENT_AMBULATORY_CARE_PROVIDER_SITE_OTHER): Payer: Medicare Other | Admitting: General Practice

## 2022-06-26 VITALS — BP 124/62 | HR 50 | Ht 74.5 in | Wt 206.0 lb

## 2022-06-26 DIAGNOSIS — I10 Essential (primary) hypertension: Secondary | ICD-10-CM | POA: Diagnosis not present

## 2022-06-26 DIAGNOSIS — E782 Mixed hyperlipidemia: Secondary | ICD-10-CM

## 2022-06-26 DIAGNOSIS — I483 Typical atrial flutter: Secondary | ICD-10-CM

## 2022-06-26 MED ORDER — DILTIAZEM HCL ER COATED BEADS 120 MG PO CP24: 120.0000 mg | ORAL_CAPSULE | Freq: Every day | ORAL | 6 refills | Status: DC

## 2022-06-26 MED ORDER — APIXABAN 5 MG PO TABS: 5.0000 mg | ORAL_TABLET | Freq: Two times a day (BID) | ORAL | 6 refills | Status: DC

## 2022-06-26 NOTE — Patient Instructions (Signed)
Medication Instructions:  The current medical regimen is effective;  continue present plan and medications as directed. Please refer to the Current Medication list given to you today.   *If you need a refill on your cardiac medications before your next appointment, please call your pharmacy*  Lab Work:   Testing/Procedures:  NONE    NONE  If you have labs (blood work) drawn today and your tests are completely normal, you will receive your results only by:  1-MyChart Message (if you have MyChart) OR 2- A paper copy in the mail.  If you have any lab test that is abnormal or we need to change your treatment, we will call you to review the results.  You may also go to any of these [facility] locations:  [address],  [place] [address] ([facility]) 3- [address]  [place] [address] [facility])  Special Instructions TAKE AND LOG YOUR BLOOD PRESSURE AT LEAST WEEKLY  Please try to avoid these triggers: Do not use any products that have nicotine or tobacco in them. These include cigarettes, e-cigarettes, and chewing tobacco. If you need help quitting, ask your doctor. Eat heart-healthy foods. Talk with your doctor about the right eating plan for you. Exercise regularly as told by your doctor. Stay hydrated Do not drink alcohol, Caffeine or chocolate. Lose weight if you are overweight. Do not use drugs, including cannabis    Follow-Up: Your next appointment:  6 month(s) In Person with Sanda Klein, MD    Please call our office 2 months in advance to schedule this appointment  :1  At [facility], you and your health needs are our priority.  As part of our continuing mission to provide you with exceptional heart  care, we have created designated Provider Care Teams.  These Care Teams include your primary Cardiologist (physician) and Advanced Practice Providers (APPs -  Physician Assistants and Nurse Practitioners) who all work together to provide you with the care you need, when you need it.  Important Information About Sugar             6 SALTY THINGS TO AVOID     1,'800MG'$  DAILY

## 2022-06-27 ENCOUNTER — Ambulatory Visit (INDEPENDENT_AMBULATORY_CARE_PROVIDER_SITE_OTHER): Payer: Medicare Other | Admitting: Cardiology

## 2022-06-27 ENCOUNTER — Encounter: Payer: Self-pay | Admitting: Cardiology

## 2022-06-27 ENCOUNTER — Telehealth: Payer: Self-pay | Admitting: Cardiology

## 2022-06-27 VITALS — BP 118/72 | HR 49 | Ht 75.0 in | Wt 206.2 lb

## 2022-06-27 DIAGNOSIS — I483 Typical atrial flutter: Secondary | ICD-10-CM | POA: Diagnosis not present

## 2022-06-27 NOTE — Telephone Encounter (Signed)
Returned call pt. Aware that we did stop medications and removed from his medication list. Patient verbalized understanding and agreeable to plan.

## 2022-06-27 NOTE — Patient Instructions (Signed)
Medication Instructions:  Your physician has recommended you make the following change in your medication:  STOP Eliquis STOP Diltiazem  *If you need a refill on your cardiac medications before your next appointment, please call your pharmacy*   Lab Work: None ordered   Testing/Procedures: None ordered   Follow-Up: At [facility], you and your health needs are our priority.  As part of our continuing mission to provide you with exceptional heart care, we have created designated Provider Care Teams.  These Care Teams include your primary Cardiologist (physician) and Advanced Practice Providers (APPs -  Physician Assistants and Nurse Practitioners) who all work together to provide you with the care you need, when you need it.  Your next appointment:   as  needed  The format for your next appointment:   In Person  Provider:   Allegra Lai, MD    Thank you for choosing [facility]!!   Trinidad Curet, RN [phone]  Other Instructions   Important Information About Sugar

## 2022-06-27 NOTE — Telephone Encounter (Signed)
Pt c/o medication issue:  1. Name of Medication: Eliquis & Diltiazem  2. How are you currently taking this medication (dosage and times per day)?   3. Are you having a reaction (difficulty breathing--STAT)?   4. What is your medication issue? Pt saw Coletta Memos, NP 08/15 and was told to start these two medications. Pt also saw Dr. Curt Bears today 08/16 and was told to not take these medications. The pt called to make sure that these medications were not order after the 08/15 appt and if they were, he would like for them to be cancelled so he is not charged for these.

## 2022-06-27 NOTE — Progress Notes (Signed)
Electrophysiology Office Note   Date:  06/27/2022   ID:  Frederick Michael, Frederick Michael [birth year], MRN [id number]  PCP:  Haywood Pao, Frederick Michael  Cardiologist: Frederick Michael Primary Electrophysiologist:  Frederick Ritacco Meredith Leeds, Frederick Michael    Chief Complaint: Atrial flutter   History of Present Illness: Frederick Michael is a 76 y.o. male who is being seen today for the evaluation of atrial flutter at the request of Frederick Michael. Presenting today for electrophysiology evaluation.  He has a history significant for hypertension and hyperlipidemia.  He presented to the hospital in atrial flutter.  He had a TEE guided cardioversion which improved his symptoms.  Unfortunately he went back into atrial flutter.  He is now status post ablation 05/16/2022.  Today, denies symptoms of palpitations, chest pain, shortness of breath, orthopnea, PND, lower extremity edema, claudication, dizziness, presyncope, syncope, bleeding, or neurologic sequela. The patient is tolerating medications without difficulties.  Since his ablation he has done well.  He has had no chest pain or shortness of breath.  He is able to all of his daily activities without restriction.  He has noted no further episodes of atrial flutter.    Past Medical History:  Diagnosis Date   DM2 (diabetes mellitus, type 2) ([place])    High cholesterol    Hypertension    Thyroid disease    Past Surgical History:  Procedure Laterality Date   A-FLUTTER ABLATION N/A 05/16/2022   Procedure: A-FLUTTER ABLATION;  Surgeon: Frederick Haw, Frederick Michael;  Location: [facility];  Service: Cardiovascular;  Laterality: N/A;   CARDIOVERSION N/A 01/30/2022   Procedure: CARDIOVERSION;  Surgeon: Frederick Klein, Frederick Michael;  Location: [facility];  Service: Cardiovascular;  Laterality: N/A;   ELBOW SURGERY     TEE WITHOUT CARDIOVERSION N/A 01/30/2022   Procedure: TRANSESOPHAGEAL ECHOCARDIOGRAM (TEE);  Surgeon: Frederick Klein, Frederick Michael;  Location: [facility];  Service: Cardiovascular;   Laterality: N/A;     Current Outpatient Medications  Medication Sig Dispense Refill   hydrochlorothiazide (HYDRODIURIL) 12.5 MG tablet Take 12.5 mg by mouth daily.     levothyroxine (SYNTHROID) 150 MCG tablet Take 150 mcg by mouth daily before breakfast.     lisinopril (ZESTRIL) 40 MG tablet Take 40 mg by mouth daily.     pravastatin (PRAVACHOL) 80 MG tablet Take 80 mg by mouth at bedtime.     No current facility-administered medications for this visit.    Allergies:   Patient has no known allergies.   Social History:  The patient  reports that he has quit smoking. His smoking use included cigarettes. He has quit using smokeless tobacco. He reports current alcohol use of about 3.0 standard drinks of alcohol per week. He reports that he does not use drugs.   Family History:  The patient's family history includes Cancer in his father.   ROS:  Please see the history of present illness.   Otherwise, review of systems is positive for none.   All other systems are reviewed and negative.   PHYSICAL EXAM: VS:  BP 118/72   Pulse (!) 49   Ht '6\' 3"'$  (1.905 m)   Wt 206 lb 3.2 oz (93.5 kg)   SpO2 97%   BMI 25.77 kg/m  , BMI Body mass index is 25.77 kg/m. GEN: Well nourished, well developed, in no acute distress  HEENT: normal  Neck: no JVD, carotid bruits, or masses Cardiac: RRR; no murmurs, rubs, or gallops,no edema  Respiratory:  clear to auscultation bilaterally, normal work of breathing GI:  soft, nontender, nondistended, + BS MS: no deformity or atrophy  Skin: warm and dry Neuro:  Strength and sensation are intact Psych: euthymic mood, full affect  EKG:  EKG is ordered today. Personal review of the ekg ordered shows sinus rhythm   Recent Labs: 01/28/2022: TSH 8.764 04/23/2022: ALT 18; BUN 16; Creatinine, Ser 0.71; Hemoglobin 13.2; Magnesium 2.0; Platelets 139; Potassium 3.9; Sodium 138    Lipid Panel     Component Value Date/Time   CHOL 125 01/29/2022 0031   TRIG 110  01/29/2022 0031   HDL 25 (L) 01/29/2022 0031   CHOLHDL 5.0 01/29/2022 0031   VLDL 22 01/29/2022 0031   LDLCALC 78 01/29/2022 0031     Wt Readings from Last 3 Encounters:  06/27/22 206 lb 3.2 oz (93.5 kg)  06/26/22 206 lb (93.4 kg)  05/16/22 201 lb (91.2 kg)      Other studies Reviewed: Additional studies/ records that were reviewed today include: TTE 01/28/22  Review of the above records today demonstrates:   1. Left ventricular ejection fraction, by estimation, is 60 to 65%. The  left ventricle has normal function. The left ventricle has no regional  wall motion abnormalities. There is mild left ventricular hypertrophy of  the basal-septal segment. Left  ventricular diastolic function could not be evaluated.   2. Right ventricular systolic function is normal. The right ventricular  size is mildly enlarged.   3. Left atrial size was mildly dilated.   4. The mitral valve is normal in structure. No evidence of mitral valve  regurgitation. No evidence of mitral stenosis.   5. The aortic valve is normal in structure. Aortic valve regurgitation is  not visualized. No aortic stenosis is present.   6. The inferior vena cava is normal in size with greater than 50%  respiratory variability, suggesting right atrial pressure of 3 mmHg.    ASSESSMENT AND PLAN:  1.  Typical atrial flutter: Status post cardioversion.  CHA2DS2-VASc of 2.  Currently on Eliquis 5 mg twice daily.  Status post ablation 05/16/2022.  He has had no further episodes of atrial flutter.  He is quite happy with his control.  We Jori Thrall stop Eliquis and diltiazem today.  2.  Hypertension: Currently well controlled  3.  Hyperlipidemia: Continue pravastatin per primary physician  Current medicines are reviewed at length with the patient today.   The patient does not have concerns regarding his medicines.  The following changes were made today: Stop Eliquis, diltiazem  Labs/ tests ordered today include:  Orders Placed This  Encounter  Procedures   EKG 12-Lead     Disposition:   FU with Ziere Docken as needed months  Signed, Teigen Bellin Meredith Leeds, Frederick Michael  06/27/2022 1:48 PM     [place] [address] [phone] (office) [phone] (fax)

## 2022-07-05 DIAGNOSIS — I480 Paroxysmal atrial fibrillation: Secondary | ICD-10-CM | POA: Diagnosis not present

## 2022-07-05 DIAGNOSIS — E119 Type 2 diabetes mellitus without complications: Secondary | ICD-10-CM | POA: Diagnosis not present

## 2022-07-05 DIAGNOSIS — E039 Hypothyroidism, unspecified: Secondary | ICD-10-CM | POA: Diagnosis not present

## 2022-07-05 DIAGNOSIS — E7800 Pure hypercholesterolemia, unspecified: Secondary | ICD-10-CM | POA: Diagnosis not present

## 2022-07-05 DIAGNOSIS — I10 Essential (primary) hypertension: Secondary | ICD-10-CM | POA: Diagnosis not present

## 2022-10-16 IMAGING — DX DG CHEST 1V PORT
1 series · 1 of 1 positions shown · non-contrast
Comparison: Chest x-ray 01/27/2022

CLINICAL DATA: Questionable sepsis - evaluate for abnormality

EXAM:
PORTABLE CHEST 1 VIEW

[chest ap]
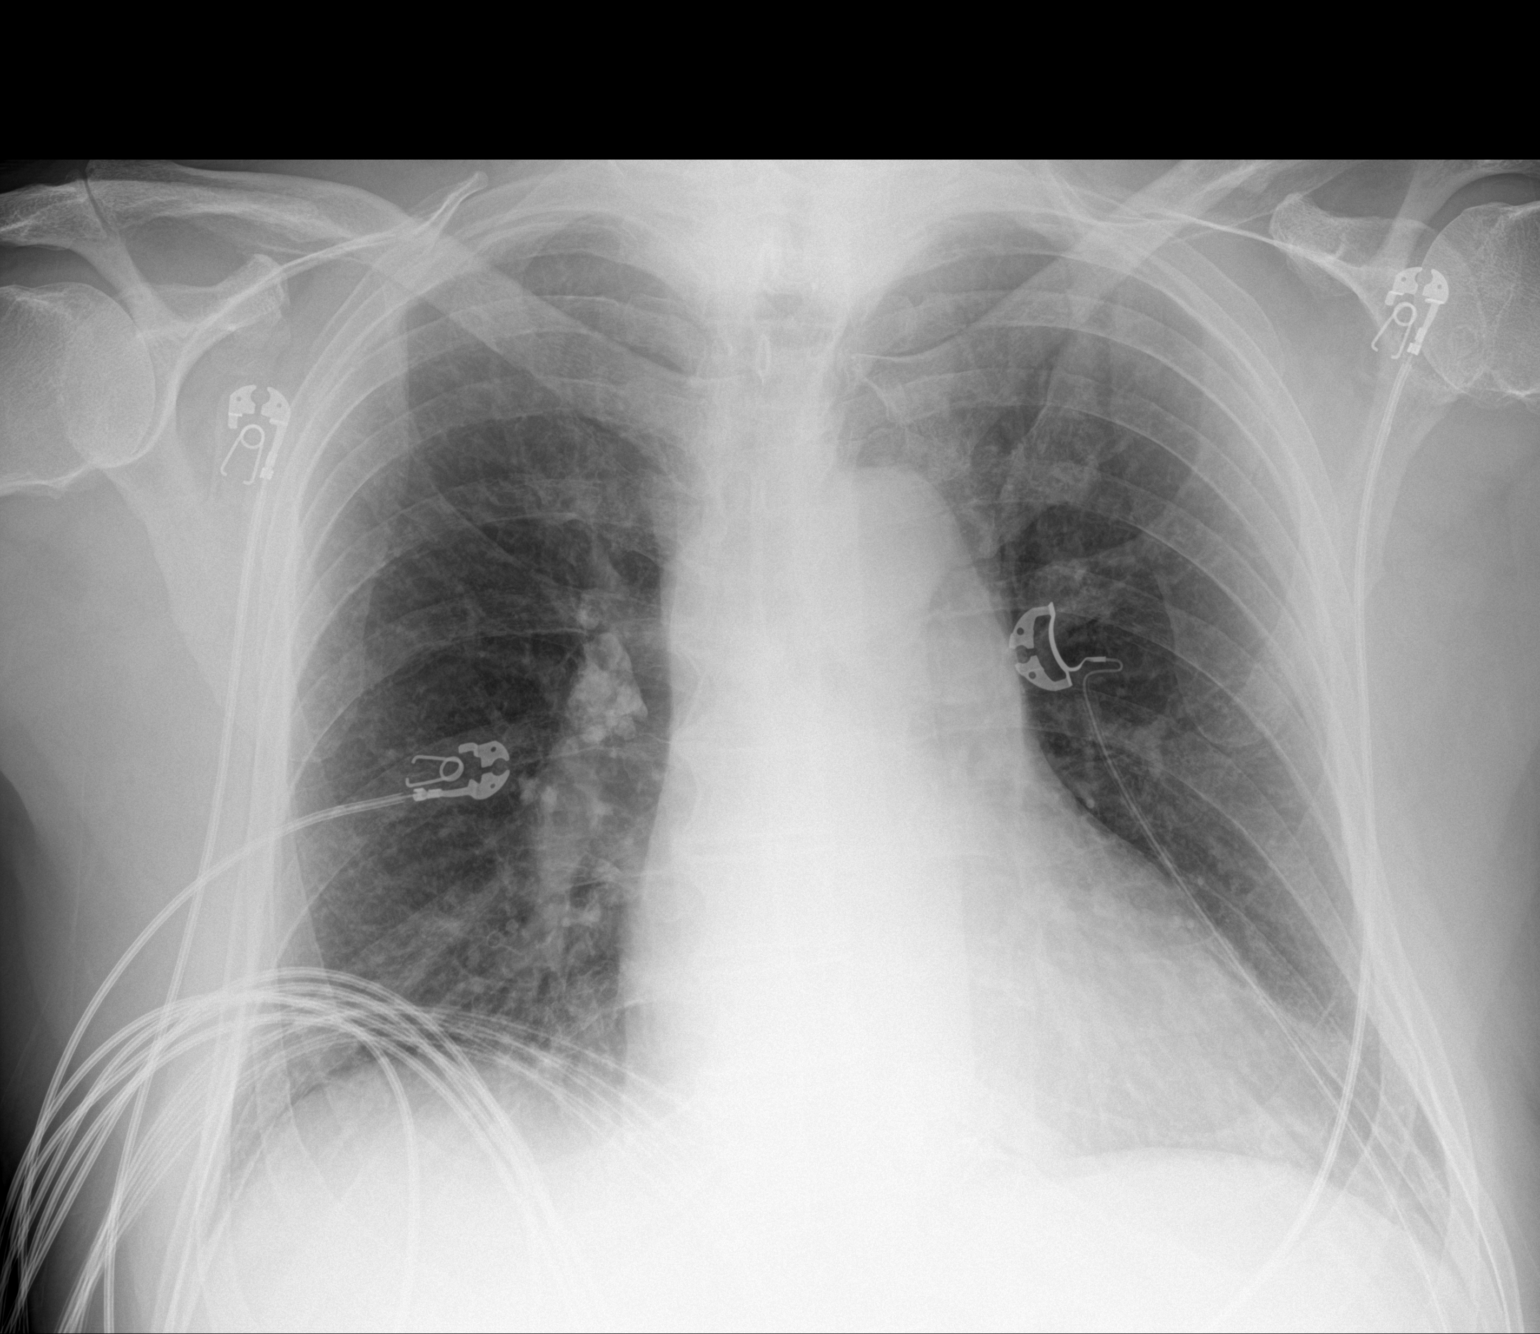

[1 of 1 positions shown; findings below may reference images not displayed]

FINDINGS: The heart and mediastinal contours are unchanged.

No focal consolidation. No pulmonary edema. No pleural effusion. No
pneumothorax.

No acute osseous abnormality.
IMPRESSION: No active disease.

## 2022-11-30 DIAGNOSIS — L814 Other melanin hyperpigmentation: Secondary | ICD-10-CM | POA: Diagnosis not present

## 2022-11-30 DIAGNOSIS — L570 Actinic keratosis: Secondary | ICD-10-CM | POA: Diagnosis not present

## 2022-11-30 DIAGNOSIS — L821 Other seborrheic keratosis: Secondary | ICD-10-CM | POA: Diagnosis not present

## 2022-11-30 DIAGNOSIS — D1801 Hemangioma of skin and subcutaneous tissue: Secondary | ICD-10-CM | POA: Diagnosis not present

## 2022-11-30 DIAGNOSIS — D225 Melanocytic nevi of trunk: Secondary | ICD-10-CM | POA: Diagnosis not present

## 2022-12-19 DIAGNOSIS — E7800 Pure hypercholesterolemia, unspecified: Secondary | ICD-10-CM | POA: Diagnosis not present

## 2022-12-19 DIAGNOSIS — I10 Essential (primary) hypertension: Secondary | ICD-10-CM | POA: Diagnosis not present

## 2022-12-19 DIAGNOSIS — R7989 Other specified abnormal findings of blood chemistry: Secondary | ICD-10-CM | POA: Diagnosis not present

## 2022-12-19 DIAGNOSIS — E039 Hypothyroidism, unspecified: Secondary | ICD-10-CM | POA: Diagnosis not present

## 2022-12-19 DIAGNOSIS — Z125 Encounter for screening for malignant neoplasm of prostate: Secondary | ICD-10-CM | POA: Diagnosis not present

## 2022-12-19 DIAGNOSIS — E119 Type 2 diabetes mellitus without complications: Secondary | ICD-10-CM | POA: Diagnosis not present

## 2022-12-19 DIAGNOSIS — Z1212 Encounter for screening for malignant neoplasm of rectum: Secondary | ICD-10-CM | POA: Diagnosis not present

## 2022-12-26 DIAGNOSIS — E119 Type 2 diabetes mellitus without complications: Secondary | ICD-10-CM | POA: Diagnosis not present

## 2022-12-26 DIAGNOSIS — Z23 Encounter for immunization: Secondary | ICD-10-CM | POA: Diagnosis not present

## 2022-12-26 DIAGNOSIS — Z0000 Encounter for general adult medical examination without abnormal findings: Secondary | ICD-10-CM | POA: Diagnosis not present

## 2022-12-26 DIAGNOSIS — Z1331 Encounter for screening for depression: Secondary | ICD-10-CM | POA: Diagnosis not present

## 2022-12-26 DIAGNOSIS — R82998 Other abnormal findings in urine: Secondary | ICD-10-CM | POA: Diagnosis not present

## 2022-12-26 DIAGNOSIS — E7800 Pure hypercholesterolemia, unspecified: Secondary | ICD-10-CM | POA: Diagnosis not present

## 2022-12-26 DIAGNOSIS — Z1339 Encounter for screening examination for other mental health and behavioral disorders: Secondary | ICD-10-CM | POA: Diagnosis not present

## 2022-12-26 DIAGNOSIS — I480 Paroxysmal atrial fibrillation: Secondary | ICD-10-CM | POA: Diagnosis not present

## 2022-12-26 DIAGNOSIS — E039 Hypothyroidism, unspecified: Secondary | ICD-10-CM | POA: Diagnosis not present

## 2022-12-26 DIAGNOSIS — I10 Essential (primary) hypertension: Secondary | ICD-10-CM | POA: Diagnosis not present

## 2023-01-11 ENCOUNTER — Other Ambulatory Visit (HOSPITAL_BASED_OUTPATIENT_CLINIC_OR_DEPARTMENT_OTHER): Payer: Self-pay

## 2023-01-11 ENCOUNTER — Emergency Department (HOSPITAL_BASED_OUTPATIENT_CLINIC_OR_DEPARTMENT_OTHER): Payer: Medicare Other

## 2023-01-11 ENCOUNTER — Emergency Department (HOSPITAL_BASED_OUTPATIENT_CLINIC_OR_DEPARTMENT_OTHER)
Admission: EM | Admit: 2023-01-11 | Discharge: 2023-01-11 | Disposition: A | Discharge status: Home / Self Care | Payer: Medicare Other | Attending: Emergency Medicine | Admitting: Emergency Medicine

## 2023-01-11 ENCOUNTER — Encounter (HOSPITAL_BASED_OUTPATIENT_CLINIC_OR_DEPARTMENT_OTHER): Payer: Self-pay | Admitting: Emergency Medicine

## 2023-01-11 ENCOUNTER — Other Ambulatory Visit: Payer: Self-pay

## 2023-01-11 DIAGNOSIS — S61412A Laceration without foreign body of left hand, initial encounter: Secondary | ICD-10-CM | POA: Diagnosis not present

## 2023-01-11 DIAGNOSIS — Z23 Encounter for immunization: Secondary | ICD-10-CM | POA: Insufficient documentation

## 2023-01-11 DIAGNOSIS — S61211A Laceration without foreign body of left index finger without damage to nail, initial encounter: Secondary | ICD-10-CM | POA: Insufficient documentation

## 2023-01-11 DIAGNOSIS — S61310A Laceration without foreign body of right index finger with damage to nail, initial encounter: Secondary | ICD-10-CM | POA: Diagnosis not present

## 2023-01-11 DIAGNOSIS — W312XXA Contact with powered woodworking and forming machines, initial encounter: Secondary | ICD-10-CM | POA: Insufficient documentation

## 2023-01-11 DIAGNOSIS — S6992XA Unspecified injury of left wrist, hand and finger(s), initial encounter: Secondary | ICD-10-CM | POA: Diagnosis present

## 2023-01-11 DIAGNOSIS — S61213A Laceration without foreign body of left middle finger without damage to nail, initial encounter: Secondary | ICD-10-CM | POA: Diagnosis not present

## 2023-01-11 DIAGNOSIS — S61311A Laceration without foreign body of left index finger with damage to nail, initial encounter: Secondary | ICD-10-CM

## 2023-01-11 MED ORDER — TETANUS-DIPHTH-ACELL PERTUSSIS 5-2.5-18.5 LF-MCG/0.5 IM SUSY
0.5000 mL | PREFILLED_SYRINGE | Freq: Once | INTRAMUSCULAR | Status: AC
  Administered 2023-01-11: 0.5 mL via INTRAMUSCULAR
  Filled 2023-01-11: qty 0.5

## 2023-01-11 MED ORDER — LIDOCAINE HCL (PF) 1 % IJ SOLN
30.0000 mL | Freq: Once | INTRAMUSCULAR | Status: AC
  Administered 2023-01-11: 5 mL
  Filled 2023-01-11: qty 30

## 2023-01-11 NOTE — Discharge Instructions (Signed)
Please call the hand specialist I have attached here for you.  Please read the attachment on laceration wound care.  You may take Tylenol as needed for pain every 6 hours.  If symptoms worsen please return to ER.

## 2023-01-11 NOTE — ED Provider Notes (Cosign Needed Addendum)
[facility] Provider Note   CSN: [id number] Arrival date & time: 01/11/23  1309     History  Chief Complaint  Patient presents with   Laceration    Frederick Michael is a 77 y.o. male presented with a laceration to his left second and third digits at the very distal aspect.  Patient states he had an incident with a circular saw up an hour ago.  Patient endorsed sensation, movement in left hand without skin color changes.  Patient does not know when his last tetanus was.  Patient denied being on any blood thinners or bleeding disorders.   Home Medications Prior to Admission medications   Medication Sig Start Date End Date Taking? Authorizing Provider  hydrochlorothiazide (HYDRODIURIL) 12.5 MG tablet Take 12.5 mg by mouth daily.    [provider]  levothyroxine (SYNTHROID) 150 MCG tablet Take 150 mcg by mouth daily before breakfast. 12/20/21   [provider]  lisinopril (ZESTRIL) 40 MG tablet Take 40 mg by mouth daily.    [provider]  pravastatin (PRAVACHOL) 80 MG tablet Take 80 mg by mouth at bedtime. 01/23/22   [provider]      Allergies    Patient has no known allergies.    Review of Systems   Review of Systems See HPI Physical Exam Updated Vital Signs BP 120/67 (BP Location: Right Arm)   Pulse 64   Temp 98.3 F (36.8 C)   Resp 18   SpO2 98%  Physical Exam Vitals and nursing note reviewed.  Constitutional:      General: He is not in acute distress.    Appearance: He is well-developed.  HENT:     Head: Normocephalic and atraumatic.  Eyes:     Conjunctiva/sclera: Conjunctivae normal.  Cardiovascular:     Rate and Rhythm: Normal rate and regular rhythm.     Comments: 2+ bilateral radial pulses with regular rate before and after suture Pulmonary:     Effort: Pulmonary effort is normal. No respiratory distress.  Abdominal:     Palpations: Abdomen is soft.     Tenderness: There  is no abdominal tenderness.  Musculoskeletal:        General: Signs of injury (1 cm laceration at the distal ends of the lower second and third digit with no bony involvement) present. No swelling.     Cervical back: Normal range of motion and neck supple.     Comments: Full active range of motion in patient's PIP/DIP/MCP before and after suture No bony involvment was noticed on exam  Skin:    General: Skin is warm and dry.     Capillary Refill: Capillary refill takes less than 2 seconds.  Neurological:     General: No focal deficit present.     Mental Status: He is alert and oriented to person, place, and time.     Comments: Sensation intact before and after suture  Psychiatric:        Mood and Affect: Mood normal.     ED Results / Procedures / Treatments   Labs (all labs ordered are listed, but only abnormal results are displayed) Labs Reviewed - No data to display  EKG None  Radiology DG Hand Complete Left  Result Date: 01/11/2023 CLINICAL DATA:  Laceration EXAM: LEFT HAND - COMPLETE 3 VIEW COMPARISON:  None Available. FINDINGS: No fracture or dislocation. Preserved bone mineralization. There are bandages along the distal aspect of second and third fingers with  truncation of soft tissues at the extreme distal tips of the fingers. Please correlate for soft tissue injury. No radiopaque foreign body. IMPRESSION: Soft tissue injury to the tips of second and third fingers with bandages. No radiopaque foreign body. No underlying fracture. Electronically Signed   By: Jill Side M.D.   On: 01/11/2023 14:14    Procedures .Marland KitchenLaceration Repair  Date/Time: 01/11/2023 2:57 PM  Performed by: Chuck Hint, PA-C Authorized by: Chuck Hint, PA-C   Consent:    Consent obtained:  Verbal   Consent given by:  Patient   Risks, benefits, and alternatives were discussed: yes     Risks discussed:  Infection, need for additional repair, nerve damage, pain, poor cosmetic result and poor  wound healing   Alternatives discussed:  No treatment Universal protocol:    Imaging studies available: yes     Patient identity confirmed:  Verbally with patient Anesthesia:    Anesthesia method:  Nerve block   Block location:  Base of second digit   Block needle gauge:  25 G   Block anesthetic:  Lidocaine 1% w/o epi   Block technique:  Digital block   Block outcome:  Anesthesia achieved Laceration details:    Location:  Finger   Finger location:  L index finger   Length (cm):  1   Depth (mm):  1 Treatment:    Area cleansed with:  Saline   Amount of cleaning:  Standard   Irrigation solution:  Sterile saline   Irrigation volume:  100 mL   Irrigation method:  Pressure wash   Visualized foreign bodies/material removed: no     Debridement:  None Skin repair:    Repair method:  Sutures   Suture size:  5-0   Suture material:  Prolene   Suture technique:  Simple interrupted   Number of sutures:  2 Approximation:    Approximation:  Close Repair type:    Repair type:  Simple Post-procedure details:    Dressing:  Bulky dressing   Procedure completion:  Tolerated .Marland KitchenLaceration Repair  Date/Time: 01/11/2023 2:58 PM  Performed by: Chuck Hint, PA-C Authorized by: Chuck Hint, PA-C   Consent:    Consent obtained:  Verbal   Consent given by:  Patient   Risks, benefits, and alternatives were discussed: yes     Risks discussed:  Infection, need for additional repair, nerve damage, pain, poor cosmetic result and poor wound healing   Alternatives discussed:  No treatment Universal protocol:    Imaging studies available: yes     Patient identity confirmed:  Verbally with patient Anesthesia:    Anesthesia method:  Local infiltration   Local anesthetic:  Lidocaine 1% w/o epi Laceration details:    Location:  Finger   Finger location:  L long finger   Length (cm):  1   Depth (mm):  1 Treatment:    Area cleansed with:  Saline   Amount of cleaning:  Standard    Irrigation solution:  Sterile saline   Irrigation volume:  100 mL   Visualized foreign bodies/material removed: no     Debridement:  None Skin repair:    Repair method:  Sutures   Suture size:  5-0   Suture material:  Prolene   Suture technique:  Simple interrupted   Number of sutures:  2 Approximation:    Approximation:  Close Repair type:    Repair type:  Simple Post-procedure details:    Dressing:  Bulky dressing   Procedure  completion:  Tolerated     Medications Ordered in ED Medications  lidocaine (PF) (XYLOCAINE) 1 % injection 30 mL (5 mLs Infiltration Given 01/11/23 1430)  Tdap (BOOSTRIX) injection 0.5 mL (0.5 mLs Intramuscular Given 01/11/23 1428)    ED Course/ Medical Decision Making/ A&P                             Medical Decision Making Amount and/or Complexity of Data Reviewed Radiology: ordered.  Risk Prescription drug management.   Frederick Michael 77 y.o. presented today for finger lacerations. Working DDx that I considered at this time includes, but not limited to, fracture, foreign body, neurovascular compromise.  Review of prior external notes: None  Unique Tests and My Interpretation:  Left hand x-ray: No foreign bodies or fractures noted  Discussion with Independent Historian: Wife  Discussion of Management of Tests: None  Risk: Low:  - based on diagnostic testing/clinical impression and treatment plan   Risk Stratification Score: None   R/o DDx: Foreign body, fracture: X-ray negative Neurovascular compromise: Sensation was intact distally, patient was able to range fingers, patient had good radial pulses  Plan: Patient presented for finger lacerations.  Patient had pulse motor and sensation intact before and after suture.  Patient's tetanus was updated.  Lacerations were at the distal ends of the left second and third digit with the second digit having nailbed involvement.  Patient's lacerations were sutured after negative x-rays and  will be encouraged to follow-up with hand in the next few days.  Patient stable for discharge at this time.  Patient received 2 sutures each in the left index finger and left middle finger.  Patient does not need any antibiotics at this time.  A picture of the patient's laceration have been taking with the work Automotive engineer however when transferring to the computer the patient did not transfer and was not saved on the phone either.  Patient was given return precautions.patient stable for discharge at this time.  Patient verbalized understanding of plan.         Final Clinical Impression(s) / ED Diagnoses Final diagnoses:  Laceration of left middle finger without damage to nail, foreign body presence unspecified, initial encounter  Laceration of left index finger with damage to nail, foreign body presence unspecified, initial encounter    Rx / DC Orders ED Discharge Orders     None         Elvina Sidle 01/11/23 [place], [place], DO 01/11/23 1525    Chuck Hint, PA-C 01/11/23 [address] [place], [place] 01/14/23 [phone]

## 2023-01-11 NOTE — ED Triage Notes (Signed)
Pt reports cutting his 1st and 2nd fingers on the left hand. Bleeding controlled at this time.

## 2023-01-11 NOTE — ED Notes (Signed)
Pt given discharge instructions. Opportunities given for questions. Pt verbalizes understanding. Leanne Chang, RN

## 2023-01-15 DIAGNOSIS — M79645 Pain in left finger(s): Secondary | ICD-10-CM | POA: Diagnosis not present

## 2023-01-15 DIAGNOSIS — S61319A Laceration without foreign body of unspecified finger with damage to nail, initial encounter: Secondary | ICD-10-CM | POA: Diagnosis not present

## 2023-01-17 ENCOUNTER — Telehealth: Payer: Self-pay | Admitting: *Deleted

## 2023-01-17 NOTE — Telephone Encounter (Signed)
     Patient  visit on 01/11/2023  at [facility]  was for wound  Have you been able to follow up with your primary care physician? Saw Pcp and the wound is looking great  The patient was or was not able to obtain any needed medicine or equipment.  Are there diet recommendations that you are having difficulty following?  Patient expresses understanding of discharge instructions and education provided has no other needs at this time.   [facility] [phone] [address] , [place] [address] Email : [email or web address]

## 2023-01-22 DIAGNOSIS — S61203A Unspecified open wound of left middle finger without damage to nail, initial encounter: Secondary | ICD-10-CM | POA: Diagnosis not present

## 2023-01-22 DIAGNOSIS — S61201A Unspecified open wound of left index finger without damage to nail, initial encounter: Secondary | ICD-10-CM | POA: Diagnosis not present

## 2023-02-07 DIAGNOSIS — S61201A Unspecified open wound of left index finger without damage to nail, initial encounter: Secondary | ICD-10-CM | POA: Diagnosis not present

## 2023-02-07 DIAGNOSIS — S61203A Unspecified open wound of left middle finger without damage to nail, initial encounter: Secondary | ICD-10-CM | POA: Diagnosis not present

## 2023-08-02 DIAGNOSIS — E039 Hypothyroidism, unspecified: Secondary | ICD-10-CM | POA: Diagnosis not present

## 2023-08-02 DIAGNOSIS — I10 Essential (primary) hypertension: Secondary | ICD-10-CM | POA: Diagnosis not present

## 2023-08-02 DIAGNOSIS — E119 Type 2 diabetes mellitus without complications: Secondary | ICD-10-CM | POA: Diagnosis not present

## 2023-08-02 DIAGNOSIS — Z23 Encounter for immunization: Secondary | ICD-10-CM | POA: Diagnosis not present

## 2023-08-02 DIAGNOSIS — I480 Paroxysmal atrial fibrillation: Secondary | ICD-10-CM | POA: Diagnosis not present

## 2023-08-02 DIAGNOSIS — E7800 Pure hypercholesterolemia, unspecified: Secondary | ICD-10-CM | POA: Diagnosis not present

## 2023-08-06 DIAGNOSIS — Z23 Encounter for immunization: Secondary | ICD-10-CM | POA: Diagnosis not present

## 2023-12-02 DIAGNOSIS — D225 Melanocytic nevi of trunk: Secondary | ICD-10-CM | POA: Diagnosis not present

## 2023-12-02 DIAGNOSIS — L821 Other seborrheic keratosis: Secondary | ICD-10-CM | POA: Diagnosis not present

## 2023-12-02 DIAGNOSIS — L814 Other melanin hyperpigmentation: Secondary | ICD-10-CM | POA: Diagnosis not present

## 2023-12-02 DIAGNOSIS — L570 Actinic keratosis: Secondary | ICD-10-CM | POA: Diagnosis not present

## 2023-12-02 DIAGNOSIS — D1801 Hemangioma of skin and subcutaneous tissue: Secondary | ICD-10-CM | POA: Diagnosis not present

## 2023-12-26 DIAGNOSIS — I10 Essential (primary) hypertension: Secondary | ICD-10-CM | POA: Diagnosis not present

## 2023-12-26 DIAGNOSIS — Z1212 Encounter for screening for malignant neoplasm of rectum: Secondary | ICD-10-CM | POA: Diagnosis not present

## 2023-12-26 DIAGNOSIS — Z125 Encounter for screening for malignant neoplasm of prostate: Secondary | ICD-10-CM | POA: Diagnosis not present

## 2023-12-26 DIAGNOSIS — E119 Type 2 diabetes mellitus without complications: Secondary | ICD-10-CM | POA: Diagnosis not present

## 2023-12-26 DIAGNOSIS — E785 Hyperlipidemia, unspecified: Secondary | ICD-10-CM | POA: Diagnosis not present

## 2023-12-26 DIAGNOSIS — E039 Hypothyroidism, unspecified: Secondary | ICD-10-CM | POA: Diagnosis not present

## 2023-12-26 DIAGNOSIS — E7800 Pure hypercholesterolemia, unspecified: Secondary | ICD-10-CM | POA: Diagnosis not present

## 2023-12-31 DIAGNOSIS — R82998 Other abnormal findings in urine: Secondary | ICD-10-CM | POA: Diagnosis not present

## 2023-12-31 DIAGNOSIS — E785 Hyperlipidemia, unspecified: Secondary | ICD-10-CM | POA: Diagnosis not present

## 2023-12-31 DIAGNOSIS — E119 Type 2 diabetes mellitus without complications: Secondary | ICD-10-CM | POA: Diagnosis not present

## 2023-12-31 DIAGNOSIS — Z1212 Encounter for screening for malignant neoplasm of rectum: Secondary | ICD-10-CM | POA: Diagnosis not present

## 2023-12-31 DIAGNOSIS — I10 Essential (primary) hypertension: Secondary | ICD-10-CM | POA: Diagnosis not present

## 2024-01-02 DIAGNOSIS — Z0000 Encounter for general adult medical examination without abnormal findings: Secondary | ICD-10-CM | POA: Diagnosis not present

## 2024-01-02 DIAGNOSIS — Z1331 Encounter for screening for depression: Secondary | ICD-10-CM | POA: Diagnosis not present

## 2024-01-02 DIAGNOSIS — Z1339 Encounter for screening examination for other mental health and behavioral disorders: Secondary | ICD-10-CM | POA: Diagnosis not present

## 2024-01-02 DIAGNOSIS — I10 Essential (primary) hypertension: Secondary | ICD-10-CM | POA: Diagnosis not present

## 2024-01-02 DIAGNOSIS — E7800 Pure hypercholesterolemia, unspecified: Secondary | ICD-10-CM | POA: Diagnosis not present

## 2024-01-02 DIAGNOSIS — I480 Paroxysmal atrial fibrillation: Secondary | ICD-10-CM | POA: Diagnosis not present

## 2024-01-02 DIAGNOSIS — E039 Hypothyroidism, unspecified: Secondary | ICD-10-CM | POA: Diagnosis not present

## 2024-01-02 DIAGNOSIS — F418 Other specified anxiety disorders: Secondary | ICD-10-CM | POA: Diagnosis not present

## 2024-01-02 DIAGNOSIS — E119 Type 2 diabetes mellitus without complications: Secondary | ICD-10-CM | POA: Diagnosis not present

## 2024-03-31 DIAGNOSIS — I480 Paroxysmal atrial fibrillation: Secondary | ICD-10-CM | POA: Diagnosis not present

## 2024-03-31 DIAGNOSIS — F418 Other specified anxiety disorders: Secondary | ICD-10-CM | POA: Diagnosis not present

## 2024-03-31 DIAGNOSIS — E119 Type 2 diabetes mellitus without complications: Secondary | ICD-10-CM | POA: Diagnosis not present

## 2024-03-31 DIAGNOSIS — I10 Essential (primary) hypertension: Secondary | ICD-10-CM | POA: Diagnosis not present

## 2024-03-31 DIAGNOSIS — E039 Hypothyroidism, unspecified: Secondary | ICD-10-CM | POA: Diagnosis not present

## 2024-03-31 DIAGNOSIS — E7800 Pure hypercholesterolemia, unspecified: Secondary | ICD-10-CM | POA: Diagnosis not present

## 2024-08-04 DIAGNOSIS — E119 Type 2 diabetes mellitus without complications: Secondary | ICD-10-CM | POA: Diagnosis not present

## 2024-08-04 DIAGNOSIS — E039 Hypothyroidism, unspecified: Secondary | ICD-10-CM | POA: Diagnosis not present

## 2024-08-04 DIAGNOSIS — Z23 Encounter for immunization: Secondary | ICD-10-CM | POA: Diagnosis not present

## 2024-08-04 DIAGNOSIS — E7800 Pure hypercholesterolemia, unspecified: Secondary | ICD-10-CM | POA: Diagnosis not present

## 2024-08-04 DIAGNOSIS — I480 Paroxysmal atrial fibrillation: Secondary | ICD-10-CM | POA: Diagnosis not present

## 2024-08-04 DIAGNOSIS — I10 Essential (primary) hypertension: Secondary | ICD-10-CM | POA: Diagnosis not present

## 2024-08-04 DIAGNOSIS — F418 Other specified anxiety disorders: Secondary | ICD-10-CM | POA: Diagnosis not present

## 2024-08-14 DIAGNOSIS — E119 Type 2 diabetes mellitus without complications: Secondary | ICD-10-CM | POA: Diagnosis not present

## 2024-08-14 DIAGNOSIS — E039 Hypothyroidism, unspecified: Secondary | ICD-10-CM | POA: Diagnosis not present

## 2024-08-14 DIAGNOSIS — R002 Palpitations: Secondary | ICD-10-CM | POA: Diagnosis not present

## 2024-08-14 DIAGNOSIS — I480 Paroxysmal atrial fibrillation: Secondary | ICD-10-CM | POA: Diagnosis not present

## 2024-08-14 DIAGNOSIS — I10 Essential (primary) hypertension: Secondary | ICD-10-CM | POA: Diagnosis not present

## 2024-08-14 DIAGNOSIS — F418 Other specified anxiety disorders: Secondary | ICD-10-CM | POA: Diagnosis not present

## 2024-09-15 ENCOUNTER — Ambulatory Visit: Attending: Cardiovascular Disease | Admitting: Cardiovascular Disease

## 2024-09-15 ENCOUNTER — Ambulatory Visit: Attending: Cardiovascular Disease

## 2024-09-15 ENCOUNTER — Encounter: Payer: Self-pay | Admitting: Cardiovascular Disease

## 2024-09-15 VITALS — BP 114/62 | Ht 75.0 in | Wt 202.2 lb

## 2024-09-15 DIAGNOSIS — I700 Atherosclerosis of aorta: Secondary | ICD-10-CM | POA: Diagnosis not present

## 2024-09-15 DIAGNOSIS — I480 Paroxysmal atrial fibrillation: Secondary | ICD-10-CM | POA: Insufficient documentation

## 2024-09-15 DIAGNOSIS — Z9889 Other specified postprocedural states: Secondary | ICD-10-CM | POA: Insufficient documentation

## 2024-09-15 DIAGNOSIS — I4891 Unspecified atrial fibrillation: Secondary | ICD-10-CM | POA: Insufficient documentation

## 2024-09-15 DIAGNOSIS — Z8679 Personal history of other diseases of the circulatory system: Secondary | ICD-10-CM | POA: Diagnosis not present

## 2024-09-15 DIAGNOSIS — E785 Hyperlipidemia, unspecified: Secondary | ICD-10-CM | POA: Diagnosis not present

## 2024-09-15 DIAGNOSIS — E119 Type 2 diabetes mellitus without complications: Secondary | ICD-10-CM | POA: Diagnosis not present

## 2024-09-15 DIAGNOSIS — I10 Essential (primary) hypertension: Secondary | ICD-10-CM | POA: Diagnosis not present

## 2024-09-15 MED ORDER — APIXABAN 5 MG PO TABS: 5.0000 mg | ORAL_TABLET | Freq: Two times a day (BID) | ORAL | 3 refills | Status: AC

## 2024-09-15 MED ORDER — METOPROLOL TARTRATE 25 MG PO TABS: 25.0000 mg | ORAL_TABLET | Freq: Three times a day (TID) | ORAL | 3 refills | Status: AC | PRN

## 2024-09-15 NOTE — Progress Notes (Signed)
 Cardiology Office Note   Date:  09/15/2024  ID:  Frederick Michael, Frederick Michael [birth year], MRN [id number] PCP: Tisovec, Richard W, MD  [facility] Providers Cardiologist:  Jerel Balding, MD     History of Present Illness Frederick Michael is a 78 y.o. male with a history of HTN, DM2 and successful cavotricuspid isthmus ablation for atrial flutter in July 2023 (Dr. Inocencio), here for his first follow-up in over 2 years.  He has been feeling great for the last 2 years until about a month ago when he was at his vacation home on [address].  Towards the end of a day playing golf he felt uncomfortable and not right; he did not have chest pain or dyspnea but could tell that something was wrong.  He had symptoms of orthostatic dizziness on the last hole of the golf course.  He went to bed feeling uncomfortable and had a difficult time breathing although he insist that he did not have orthopnea or PND.  The next day he continued to feel unwell and when he checked his blood pressure it was borderline low at 95/70 and his heart rate was 90 bpm which is unusually fast for him.  He went to the EMT station on [address] and they checked rhythm strip of which she showed me a copy.  He clearly had atrial fibrillation with mild RVR.  While they were debating what to do about it he spontaneously converted to normal sinus rhythm and immediately felt better.  He has not had any recurrence of those symptoms in the last month.  He never had true palpitations and appears to be arrhythmia unaware other than the not right sensation that he describes.  He is no longer on anticoagulants following a successful flutter ablation.  He has not had any recent injuries or surgeries and denies bleeding problems or falls.  Glycemic control was poor earlier this year with a hemoglobin A1c of 7.5%.  He believes it is now much better on treatment with Synjardy.  His blood pressure has been well-controlled.  He denies any  focal neurological complaints and has no history of TIA or stroke.  He does not have exertional dyspnea or angina.  His most recent lipid profile from February of this year showed an HDL of 33, LDL 65 and mildly elevated triglycerides at 195 (on Pravastatin ).  He has normal renal function and normal TSH, recently checked.  He does not have any clinically relevant vascular atherosclerotic disease.  A CT performed for nephrolithiasis in 2023 did show mild atherosclerotic plaque of the aorta and its branches.  He does not have symptoms of daytime hypersomnolence and has never been told that he is a loud snorer.  He is fairly lean.  In 2023 his echocardiogram showed normal left ventricular systolic function with EF 60 to 65%, mildly enlarged right ventricle with normal systolic function, mildly dilated left atrium, no significant valvular abnormalities.  Studies Reviewed EKG Interpretation Date/Time:  Tuesday September 15 2024 13:44:26 EST Ventricular Rate:  58 PR Interval:  178 QRS Duration:  96 QT Interval:  412 QTC Calculation: 404 R Axis:   14  Text Interpretation: Sinus bradycardia When compared with ECG of 16-May-2022 14:39, No significant change was found Confirmed by Dorian Renfro [phone]) on 09/15/2024 2:07:19 PM     Risk Assessment/Calculations  CHA2DS2-VASc Score = 5   This indicates a 7.2% annual risk of stroke. The patient's score is based upon: CHF History: 0 HTN History:  1 Diabetes History: 1 Stroke History: 0 Vascular Disease History: 1 Age Score: 2 Gender Score: 0            Physical Exam VS:  BP 114/62 (BP Location: Left Arm, Patient Position: Sitting, Cuff Size: Normal)   Ht 6' 3 (1.905 m)   Wt 202 lb 3.2 oz (91.7 kg)   BMI 25.27 kg/m        Wt Readings from Last 3 Encounters:  09/15/24 202 lb 3.2 oz (91.7 kg)  06/27/22 206 lb 3.2 oz (93.5 kg)  06/26/22 206 lb (93.4 kg)    GEN: Well nourished, well developed in no acute distress NECK: No JVD; No  carotid bruits CARDIAC: RRR, no murmurs, rubs, gallops RESPIRATORY:  Clear to auscultation without rales, wheezing or rhonchi  ABDOMEN: Soft, non-tender, non-distended EXTREMITIES:  No edema; No deformity   ASSESSMENT AND PLAN Paroxysmal atrial fibrillation: First documented atrial arrhythmia occurrence since his successful cavotricuspid isthmus ablation more than 2 years ago.  CHA2DS2-VASc score 5.  Will restart Eliquis  5 mg twice daily.  Repeat an echocardiogram to make sure there is been no structural changes that could explain the recurrent arrhythmia.  Ordered a 2-week arrhythmia monitor to get an idea of the burden of possible asymptomatic atrial fibrillation but also encouraged him to get a personal monitoring device such as an Apple Watch to self record episodes of symptomatic arrhythmia.  Briefly discussed the fact that ablation can also be successfully performed for atrial fibrillation, should the symptoms and prevalence of arrhythmia justify it, but for the time being this would be premature.  Baseline heart rate is slow at 58 bpm so it is unlikely he will tolerate daily beta-blocker therapy, but I did give him a prescription for metoprolol tartrate 25 mg to take as needed 1-2 or 3 times in a day for episodes of A-fib with RVR. HTN: Very well-controlled. DM/dyslipidemia: Fair glycemic control at 7.5% which she thinks is substantially better recently.  Triglycerides are slightly high but likely to improve with better glycemic control.  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  LDL is excellent at less than 70. Aortic atherosclerosis: Described as mild on a CT of the abdomen in 2023.  He has no symptoms of CAD or PAD.       Dispo: 14-day arrhythmia monitor, echocardiogram, start Eliquis  5 mg twice daily, given prescription for metoprolol 25 mg to use as needed for episodes of atrial fibrillation with rapid ventricular response, follow-up in 3 months  Signed, Jerel Balding, MD

## 2024-09-15 NOTE — Progress Notes (Unsigned)
 Enrolled for [employer] to mail a ZIO XT long term holter monitor to the patients address on file.

## 2024-09-15 NOTE — Patient Instructions (Signed)
 Medication Instructions:  Eliquis  5 mg twice daily Metoprolol Tartrate 25 mg- may take up to 3 times a day for Afib *If you need a refill on your cardiac medications before your next appointment, please call your pharmacy*  Lab Work: None ordered If you have labs (blood work) drawn today and your tests are completely normal, you will receive your results only by: MyChart Message (if you have MyChart) OR A paper copy in the mail If you have any lab test that is abnormal or we need to change your treatment, we will call you to review the results.  Testing/Procedures: Your physician has requested that you have an echocardiogram. Echocardiography is a painless test that uses sound waves to create images of your heart. It provides your doctor with information about the size and shape of your heart and how well your heart's chambers and valves are working. This procedure takes approximately one hour. There are no restrictions for this procedure. Please do NOT wear cologne, perfume, aftershave, or lotions (deodorant is allowed). Please arrive 15 minutes prior to your appointment time.  Please note: We ask at that you not bring children with you during ultrasound (echo/ vascular) testing. Due to room size and safety concerns, children are not allowed in the ultrasound rooms during exams. Our front office staff cannot provide observation of children in our lobby area while testing is being conducted. An adult accompanying a patient to their appointment will only be allowed in the ultrasound room at the discretion of the ultrasound technician under special circumstances. We apologize for any inconvenience.   Your physician has recommended that you wear a 14 DAY ZIO-PATCH monitor. The Zio patch cardiac monitor continuously records heart rhythm data for up to 14 days, this is for patients being evaluated for multiple types heart rhythms. For the first 24 hours post application, please avoid getting the Zio  monitor wet in the shower or by excessive sweating during exercise. After that, feel free to carry on with regular activities. Keep soaps and lotions away from the ZIO XT Patch.  This will be mailed to you, please expect 7-10 days to receive.    Applying the monitor   Shave hair from upper left chest.   Hold abrader disc by orange tab.  Rub abrader in 40 strokes over left upper chest as indicated in your monitor instructions.   Clean area with 4 enclosed alcohol  pads .  Use all pads to assure are is cleaned thoroughly.  Let dry.   Apply patch as indicated in monitor instructions.  Patch will be place under collarbone on left side of chest with arrow pointing upward.   Rub patch adhesive wings for 2 minutes.Remove white label marked 1.  Remove white label marked 2.  Rub patch adhesive wings for 2 additional minutes.   While looking in a mirror, press and release button in center of patch.  A small green light will flash 3-4 times .  This will be your only indicator the monitor has been turned on.     Do not shower for the first 24 hours.  You may shower after the first 24 hours.   Press button if you feel a symptom. You will hear a small click.  Record Date, Time and Symptom in the Patient Log Book.   When you are ready to remove patch, follow instructions on last 2 pages of Patient Log Book.  Stick patch monitor onto last page of Patient Log Book.   Place Patient  Log Book in [facility] box.  Use locking tab on box and tape box closed securely.  The [employer] has [employer] on it.  Please place in mailbox as soon as possible.  Your physician should have your test results approximately 7 days after the monitor has been mailed back to [facility].   Call [facility] Customer Care at [phone] if you have questions regarding your ZIO XT patch monitor.  Call them immediately if you see an orange light blinking on your monitor.   If your monitor falls off in less than 4  days contact our Monitor department at [phone].  If your monitor becomes loose or falls off after 4 days call [employer] at [phone] for suggestions on securing your monitor   Follow-Up: At [facility], you and your health needs are our priority.  As part of our continuing mission to provide you with exceptional heart care, our providers are all part of one team.  This team includes your primary Cardiologist (physician) and Advanced Practice Providers or APPs (Physician Assistants and Nurse Practitioners) who all work together to provide you with the care you need, when you need it.  Your next appointment:    3-4 months  Provider:   Jerel Balding, MD    We recommend signing up for the patient portal called MyChart.  Sign up information is provided on this After Visit Summary.  MyChart is used to connect with patients for Virtual Visits (Telemedicine).  Patients are able to view lab/test results, encounter notes, upcoming appointments, etc.  Non-urgent messages can be sent to your provider as well.   To learn more about what you can do with MyChart, go to [email or web address].

## 2024-10-16 ENCOUNTER — Ambulatory Visit: Payer: Self-pay | Admitting: Cardiovascular Disease

## 2024-10-16 ENCOUNTER — Ambulatory Visit (HOSPITAL_COMMUNITY)
Admission: RE | Admit: 2024-10-16 | Discharge: 2024-10-16 | Disposition: A | Source: Ambulatory Visit | Attending: Cardiovascular Disease | Admitting: Cardiovascular Disease

## 2024-10-16 DIAGNOSIS — I480 Paroxysmal atrial fibrillation: Secondary | ICD-10-CM | POA: Diagnosis not present

## 2024-10-16 LAB — ECHOCARDIOGRAM COMPLETE
Area-P 1/2: 3.24 cm2
S' Lateral: 2.5 cm

## 2024-10-20 DIAGNOSIS — I480 Paroxysmal atrial fibrillation: Secondary | ICD-10-CM | POA: Diagnosis not present

## 2024-10-21 DIAGNOSIS — I480 Paroxysmal atrial fibrillation: Secondary | ICD-10-CM | POA: Diagnosis not present

## 2024-11-16 ENCOUNTER — Ambulatory Visit: Admitting: Cardiovascular Disease

## 2024-12-28 ENCOUNTER — Ambulatory Visit: Admitting: Cardiovascular Disease
